# Patient Record
Sex: Female | Born: 1985 | Race: White | Hispanic: No | Marital: Married | Smoking: Former smoker
Health system: Southern US, Community
[De-identification: ages and names within clinical notes are randomized; demographics above are authoritative.]

## PROBLEM LIST (undated history)

## (undated) DIAGNOSIS — E349 Endocrine disorder, unspecified: Secondary | ICD-10-CM

## (undated) DIAGNOSIS — F419 Anxiety disorder, unspecified: Secondary | ICD-10-CM

## (undated) DIAGNOSIS — K589 Irritable bowel syndrome without diarrhea: Secondary | ICD-10-CM

## (undated) DIAGNOSIS — N939 Abnormal uterine and vaginal bleeding, unspecified: Secondary | ICD-10-CM

## (undated) DIAGNOSIS — D239 Other benign neoplasm of skin, unspecified: Secondary | ICD-10-CM

## (undated) DIAGNOSIS — F329 Major depressive disorder, single episode, unspecified: Secondary | ICD-10-CM

## (undated) DIAGNOSIS — F32A Depression, unspecified: Secondary | ICD-10-CM

## (undated) DIAGNOSIS — IMO0002 Reserved for concepts with insufficient information to code with codable children: Secondary | ICD-10-CM

## (undated) DIAGNOSIS — N946 Dysmenorrhea, unspecified: Secondary | ICD-10-CM

## (undated) DIAGNOSIS — D649 Anemia, unspecified: Secondary | ICD-10-CM

## (undated) DIAGNOSIS — Z8619 Personal history of other infectious and parasitic diseases: Secondary | ICD-10-CM

## (undated) DIAGNOSIS — E559 Vitamin D deficiency, unspecified: Secondary | ICD-10-CM

## (undated) HISTORY — DX: Major depressive disorder, single episode, unspecified: F32.9

## (undated) HISTORY — PX: MINOR HEMORRHOIDECTOMY: SHX6238

## (undated) HISTORY — PX: DILATION AND CURETTAGE OF UTERUS: SHX78

## (undated) HISTORY — DX: Other benign neoplasm of skin, unspecified: D23.9

## (undated) HISTORY — DX: Irritable bowel syndrome without diarrhea: K58.9

## (undated) HISTORY — PX: LEEP: SHX91

## (undated) HISTORY — DX: Personal history of other infectious and parasitic diseases: Z86.19

## (undated) HISTORY — DX: Dysmenorrhea, unspecified: N94.6

## (undated) HISTORY — DX: Abnormal uterine and vaginal bleeding, unspecified: N93.9

## (undated) HISTORY — DX: Anemia, unspecified: D64.9

## (undated) HISTORY — DX: Endocrine disorder, unspecified: E34.9

## (undated) HISTORY — DX: Depression, unspecified: F32.A

## (undated) HISTORY — DX: Anxiety disorder, unspecified: F41.9

## (undated) HISTORY — DX: Reserved for concepts with insufficient information to code with codable children: IMO0002

## (undated) HISTORY — DX: Vitamin D deficiency, unspecified: E55.9

## (undated) HISTORY — PX: COLPOSCOPY: SHX161

---

## 2012-01-08 LAB — OB RESULTS CONSOLE GC/CHLAMYDIA
Chlamydia: NEGATIVE
Gonorrhea: NEGATIVE

## 2012-01-08 LAB — OB RESULTS CONSOLE RUBELLA ANTIBODY, IGM: Rubella: IMMUNE

## 2012-01-08 LAB — OB RESULTS CONSOLE ANTIBODY SCREEN: Antibody Screen: NEGATIVE

## 2012-05-01 NOTE — L&D Delivery Note (Signed)
Patient was C/C/+2 and pushed for 10 minutes with epidural.   NSVD  female infant, Apgars 8,9, weight P.   The patient had no lacerations. Fundus was firm. EBL was expected. Placenta was delivered intact. Vagina was clear.  Baby was vigorous to bedside.  HORVATH,MICHELLE A

## 2012-06-05 LAB — OB RESULTS CONSOLE GBS: GBS: NEGATIVE

## 2012-06-29 ENCOUNTER — Inpatient Hospital Stay (HOSPITAL_COMMUNITY): Admission: AD | Admit: 2012-06-29 | Payer: Self-pay | Source: Ambulatory Visit | Admitting: Obstetrics and Gynecology

## 2012-07-03 ENCOUNTER — Telehealth (HOSPITAL_COMMUNITY): Payer: Self-pay | Admitting: *Deleted

## 2012-07-03 ENCOUNTER — Encounter (HOSPITAL_COMMUNITY): Payer: Self-pay | Admitting: *Deleted

## 2012-07-03 NOTE — Telephone Encounter (Signed)
Preadmission screen  

## 2012-07-04 ENCOUNTER — Inpatient Hospital Stay (HOSPITAL_COMMUNITY)
Admission: RE | Admit: 2012-07-04 | Discharge: 2012-07-06 | DRG: 775 | Disposition: A | Discharge status: Home / Self Care | Payer: 59 | Source: Ambulatory Visit | Attending: Obstetrics and Gynecology | Admitting: Obstetrics and Gynecology

## 2012-07-04 ENCOUNTER — Inpatient Hospital Stay (HOSPITAL_COMMUNITY): Payer: 59 | Admitting: Anesthesiology

## 2012-07-04 ENCOUNTER — Encounter (HOSPITAL_COMMUNITY): Payer: Self-pay

## 2012-07-04 ENCOUNTER — Encounter (HOSPITAL_COMMUNITY): Payer: Self-pay | Admitting: Anesthesiology

## 2012-07-04 DIAGNOSIS — O480 Post-term pregnancy: Principal | ICD-10-CM | POA: Diagnosis present

## 2012-07-04 LAB — CBC
Platelets: 228 10*3/uL (ref 150–400)
RBC: 4.19 MIL/uL (ref 3.87–5.11)
WBC: 12.3 10*3/uL — ABNORMAL HIGH (ref 4.0–10.5)

## 2012-07-04 MED ORDER — LACTATED RINGERS IV SOLN
500.0000 mL | Freq: Once | INTRAVENOUS | Status: AC
  Administered 2012-07-04: 500 mL via INTRAVENOUS

## 2012-07-04 MED ORDER — PHENYLEPHRINE 40 MCG/ML (10ML) SYRINGE FOR IV PUSH (FOR BLOOD PRESSURE SUPPORT)
80.0000 ug | PREFILLED_SYRINGE | INTRAVENOUS | Status: DC | PRN
  Filled 2012-07-04: qty 5

## 2012-07-04 MED ORDER — LACTATED RINGERS IV SOLN: 500.0000 mL | INTRAVENOUS | Status: DC | PRN

## 2012-07-04 MED ORDER — CITRIC ACID-SODIUM CITRATE 334-500 MG/5ML PO SOLN: 30.0000 mL | ORAL | Status: DC | PRN

## 2012-07-04 MED ORDER — OXYTOCIN 40 UNITS IN LACTATED RINGERS INFUSION - SIMPLE MED: 62.5000 mL/h | INTRAVENOUS | Status: DC

## 2012-07-04 MED ORDER — TERBUTALINE SULFATE 1 MG/ML IJ SOLN: 0.2500 mg | Freq: Once | INTRAMUSCULAR | Status: AC | PRN

## 2012-07-04 MED ORDER — EPHEDRINE 5 MG/ML INJ
10.0000 mg | INTRAVENOUS | Status: DC | PRN
  Filled 2012-07-04: qty 4

## 2012-07-04 MED ORDER — PHENYLEPHRINE 40 MCG/ML (10ML) SYRINGE FOR IV PUSH (FOR BLOOD PRESSURE SUPPORT): 80.0000 ug | PREFILLED_SYRINGE | INTRAVENOUS | Status: DC | PRN

## 2012-07-04 MED ORDER — DIPHENHYDRAMINE HCL 50 MG/ML IJ SOLN: 12.5000 mg | INTRAMUSCULAR | Status: DC | PRN

## 2012-07-04 MED ORDER — EPHEDRINE 5 MG/ML INJ: 10.0000 mg | INTRAVENOUS | Status: DC | PRN

## 2012-07-04 MED ORDER — OXYCODONE-ACETAMINOPHEN 5-325 MG PO TABS: 1.0000 | ORAL_TABLET | ORAL | Status: DC | PRN

## 2012-07-04 MED ORDER — OXYTOCIN BOLUS FROM INFUSION: 500.0000 mL | INTRAVENOUS | Status: DC

## 2012-07-04 MED ORDER — ONDANSETRON HCL 4 MG/2ML IJ SOLN: 4.0000 mg | Freq: Four times a day (QID) | INTRAMUSCULAR | Status: DC | PRN

## 2012-07-04 MED ORDER — LIDOCAINE HCL (PF) 1 % IJ SOLN: 30.0000 mL | INTRAMUSCULAR | Status: DC | PRN

## 2012-07-04 MED ORDER — BUTORPHANOL TARTRATE 1 MG/ML IJ SOLN: 1.0000 mg | INTRAMUSCULAR | Status: DC | PRN

## 2012-07-04 MED ORDER — IBUPROFEN 600 MG PO TABS
600.0000 mg | ORAL_TABLET | Freq: Four times a day (QID) | ORAL | Status: DC | PRN
  Administered 2012-07-05: 600 mg via ORAL
  Filled 2012-07-04: qty 1

## 2012-07-04 MED ORDER — ZOLPIDEM TARTRATE 5 MG PO TABS: 5.0000 mg | ORAL_TABLET | Freq: Every evening | ORAL | Status: DC | PRN

## 2012-07-04 MED ORDER — LACTATED RINGERS IV SOLN
INTRAVENOUS | Status: DC
  Administered 2012-07-04 (×2): via INTRAVENOUS

## 2012-07-04 MED ORDER — OXYTOCIN 40 UNITS IN LACTATED RINGERS INFUSION - SIMPLE MED
1.0000 m[IU]/min | INTRAVENOUS | Status: DC
  Administered 2012-07-04: 2 m[IU]/min via INTRAVENOUS
  Filled 2012-07-04: qty 1000

## 2012-07-04 MED ORDER — FENTANYL 2.5 MCG/ML BUPIVACAINE 1/10 % EPIDURAL INFUSION (WH - ANES)
14.0000 mL/h | INTRAMUSCULAR | Status: DC
  Administered 2012-07-04: 14 mL/h via EPIDURAL
  Filled 2012-07-04: qty 125

## 2012-07-04 MED ORDER — ACETAMINOPHEN 325 MG PO TABS: 650.0000 mg | ORAL_TABLET | ORAL | Status: DC | PRN

## 2012-07-04 NOTE — Anesthesia Preprocedure Evaluation (Signed)

## 2012-07-04 NOTE — Anesthesia Procedure Notes (Signed)
Epidural Patient location during procedure: OB Start time: 07/04/2012 11:45 PM  Staffing Performed by: anesthesiologist   Preanesthetic Checklist Completed: patient identified, site marked, surgical consent, pre-op evaluation, timeout performed, IV checked, risks and benefits discussed and monitors and equipment checked  Epidural Patient position: sitting Prep: site prepped and draped and DuraPrep Patient monitoring: continuous pulse ox and blood pressure Approach: midline Injection technique: LOR air  Needle:  Needle type: Tuohy  Needle gauge: 17 G Needle length: 9 cm and 9 Needle insertion depth: 4.5 cm Catheter type: closed end flexible Catheter size: 19 Gauge Catheter at skin depth: 9.5 cm Test dose: negative  Assessment Events: blood not aspirated, injection not painful, no injection resistance, negative IV test and no paresthesia  Additional Notes Discussed risk of headache, infection, bleeding, nerve injury and failed or incomplete block.  Patient voices understanding and wishes to proceed.  Epidural placed easily on first attempt.  No paresthesia.  Patient tolerated procedure well with no apparent complications.  Jasmine December, MD Reason for block:procedure for pain

## 2012-07-05 ENCOUNTER — Encounter (HOSPITAL_COMMUNITY): Payer: Self-pay

## 2012-07-05 LAB — RPR: RPR Ser Ql: NONREACTIVE

## 2012-07-05 MED ORDER — DIPHENHYDRAMINE HCL 25 MG PO CAPS: 25.0000 mg | ORAL_CAPSULE | Freq: Four times a day (QID) | ORAL | Status: DC | PRN

## 2012-07-05 MED ORDER — SIMETHICONE 80 MG PO CHEW: 80.0000 mg | CHEWABLE_TABLET | ORAL | Status: DC | PRN

## 2012-07-05 MED ORDER — ONDANSETRON HCL 4 MG PO TABS: 4.0000 mg | ORAL_TABLET | ORAL | Status: DC | PRN

## 2012-07-05 MED ORDER — TETANUS-DIPHTH-ACELL PERTUSSIS 5-2.5-18.5 LF-MCG/0.5 IM SUSP
0.5000 mL | Freq: Once | INTRAMUSCULAR | Status: AC
  Administered 2012-07-06: 0.5 mL via INTRAMUSCULAR

## 2012-07-05 MED ORDER — METHYLERGONOVINE MALEATE 0.2 MG/ML IJ SOLN: 0.2000 mg | INTRAMUSCULAR | Status: DC | PRN

## 2012-07-05 MED ORDER — WITCH HAZEL-GLYCERIN EX PADS: 1.0000 "application " | MEDICATED_PAD | CUTANEOUS | Status: DC | PRN

## 2012-07-05 MED ORDER — SENNOSIDES-DOCUSATE SODIUM 8.6-50 MG PO TABS
2.0000 | ORAL_TABLET | Freq: Every day | ORAL | Status: DC
  Administered 2012-07-05: 2 via ORAL

## 2012-07-05 MED ORDER — MAGNESIUM HYDROXIDE 400 MG/5ML PO SUSP: 30.0000 mL | ORAL | Status: DC | PRN

## 2012-07-05 MED ORDER — DIBUCAINE 1 % RE OINT: 1.0000 "application " | TOPICAL_OINTMENT | RECTAL | Status: DC | PRN

## 2012-07-05 MED ORDER — PRENATAL MULTIVITAMIN CH
1.0000 | ORAL_TABLET | Freq: Every day | ORAL | Status: DC
  Administered 2012-07-05 – 2012-07-06 (×2): 1 via ORAL
  Filled 2012-07-05 (×3): qty 1

## 2012-07-05 MED ORDER — IBUPROFEN 800 MG PO TABS
800.0000 mg | ORAL_TABLET | Freq: Three times a day (TID) | ORAL | Status: DC
  Administered 2012-07-05 – 2012-07-06 (×4): 800 mg via ORAL
  Filled 2012-07-05 (×4): qty 1

## 2012-07-05 MED ORDER — SODIUM CHLORIDE 0.9 % IJ SOLN: 3.0000 mL | INTRAMUSCULAR | Status: DC | PRN

## 2012-07-05 MED ORDER — ONDANSETRON HCL 4 MG/2ML IJ SOLN: 4.0000 mg | INTRAMUSCULAR | Status: DC | PRN

## 2012-07-05 MED ORDER — MEASLES, MUMPS & RUBELLA VAC ~~LOC~~ INJ
0.5000 mL | INJECTION | Freq: Once | SUBCUTANEOUS | Status: DC
  Filled 2012-07-05: qty 0.5

## 2012-07-05 MED ORDER — SODIUM CHLORIDE 0.9 % IV SOLN: 250.0000 mL | INTRAVENOUS | Status: DC | PRN

## 2012-07-05 MED ORDER — METHYLERGONOVINE MALEATE 0.2 MG PO TABS: 0.2000 mg | ORAL_TABLET | ORAL | Status: DC | PRN

## 2012-07-05 MED ORDER — LIDOCAINE HCL (PF) 1 % IJ SOLN
INTRAMUSCULAR | Status: DC | PRN
  Administered 2012-07-04 (×4): 4 mL

## 2012-07-05 MED ORDER — FERROUS SULFATE 325 (65 FE) MG PO TABS
325.0000 mg | ORAL_TABLET | Freq: Two times a day (BID) | ORAL | Status: DC
  Administered 2012-07-06: 325 mg via ORAL
  Filled 2012-07-05 (×3): qty 1

## 2012-07-05 MED ORDER — BENZOCAINE-MENTHOL 20-0.5 % EX AERO
1.0000 "application " | INHALATION_SPRAY | CUTANEOUS | Status: DC | PRN
  Filled 2012-07-05: qty 56

## 2012-07-05 MED ORDER — SODIUM CHLORIDE 0.9 % IJ SOLN: 3.0000 mL | Freq: Two times a day (BID) | INTRAMUSCULAR | Status: DC

## 2012-07-05 MED ORDER — ZOLPIDEM TARTRATE 5 MG PO TABS: 5.0000 mg | ORAL_TABLET | Freq: Every evening | ORAL | Status: DC | PRN

## 2012-07-05 MED ORDER — OXYCODONE-ACETAMINOPHEN 5-325 MG PO TABS
1.0000 | ORAL_TABLET | ORAL | Status: DC | PRN
  Administered 2012-07-05 – 2012-07-06 (×2): 1 via ORAL
  Filled 2012-07-05 (×2): qty 1

## 2012-07-05 MED ORDER — LANOLIN HYDROUS EX OINT: TOPICAL_OINTMENT | CUTANEOUS | Status: DC | PRN

## 2012-07-05 NOTE — Anesthesia Postprocedure Evaluation (Signed)
  Anesthesia Post-op Note  Patient: Victoria York  Procedure(s) Performed: * No procedures listed *  Patient Location: PACU and Mother/Baby  Anesthesia Type:Epidural  Level of Consciousness: awake, alert  and oriented  Airway and Oxygen Therapy: Patient Spontanous Breathing  Post-op Pain: none  Post-op Assessment: Post-op Vital signs reviewed, Patient's Cardiovascular Status Stable, No headache, No backache, No residual numbness and No residual motor weakness  Post-op Vital Signs: Reviewed and stable  Complications: No apparent anesthesia complications

## 2012-07-05 NOTE — H&P (Signed)
27 y.o. [redacted]w[redacted]d  [id number] comes in for post dates induction.  Otherwise has good fetal movement and no bleeding.  Past Medical History  Diagnosis Date  . Hx of varicella   . Abnormal Pap smear   . Anemia     previous pregnancy    Past Surgical History  Procedure Laterality Date  . Colposcopy    . Leep    . Minor hemorrhoidectomy    . Dilation and curettage of uterus      OB History   Grav Para Term Preterm Abortions TAB SAB Ect Mult Living   5 1 1  3 1 2   1      # Outc Date GA Lbr Len/2nd Wgt Sex Del Anes PTL Lv   1 TAB 2003           2 SAB 2007           3 [facility] 2008 [redacted]w[redacted]d 13:00 9.528UX(3KG4WN) F SVD EPI  Yes   4 SAB 2009           5 CUR               History   Social History  . Marital Status: Married    Spouse Name: N/A    Number of Children: N/A  . Years of Education: N/A   Occupational History  . Not on file.   Social History Main Topics  . Smoking status: Not on file  . Smokeless tobacco: Not on file  . Alcohol Use: No  . Drug Use: No  . Sexually Active: Yes   Other Topics Concern  . Not on file   Social History Narrative  . No narrative on file   Penicillins    Prenatal Transfer Tool  Maternal Diabetes: No Genetic Screening: Normal Maternal Ultrasounds/Referrals: Normal Fetal Ultrasounds or other Referrals:  None Maternal Substance Abuse:  No Significant Maternal Medications:  None Significant Maternal Lab Results: None  Other [id number]    Filed Vitals:   07/05/12 0431  BP: 107/70  Pulse: 79  Temp:   Resp: 18     Lungs/Cor:  NAD Abdomen:  soft, gravid Ex:  no cords, erythema SVE:  Now 8/C/-2 per nurse after pitocin FHTs:  130, good STV, NST R, now occasional mild variables Toco:  q2-3   A/P  Post term induction.  GBS neg.  Yan Okray A

## 2012-07-06 LAB — CBC
Hemoglobin: 10.5 g/dL — ABNORMAL LOW (ref 12.0–15.0)
MCH: 27.7 pg (ref 26.0–34.0)
MCHC: 33.2 g/dL (ref 30.0–36.0)
Platelets: 146 10*3/uL — ABNORMAL LOW (ref 150–400)
RBC: 3.79 MIL/uL — ABNORMAL LOW (ref 3.87–5.11)

## 2012-07-06 MED ORDER — OXYCODONE-ACETAMINOPHEN 5-325 MG PO TABS: 1.0000 | ORAL_TABLET | ORAL | Status: DC | PRN

## 2012-07-06 NOTE — Discharge Summary (Signed)
Obstetric Discharge Summary Reason for Admission: induction of labor Prenatal Procedures: none Intrapartum Procedures: spontaneous vaginal delivery Postpartum Procedures: none Complications-Operative and Postpartum: none Hemoglobin  Date Value Range Status  07/06/2012 10.5* 12.0 - 15.0 g/dL Final     HCT  Date Value Range Status  07/06/2012 31.6* 36.0 - 46.0 % Final    Physical Exam:  General: alert and cooperative Lochia: appropriate Uterine Fundus: firm DVT Evaluation: No evidence of DVT seen on physical exam.  Discharge Diagnoses: Term Pregnancy-delivered  Discharge Information: Date: 07/06/2012 Activity: pelvic rest Diet: routine Medications: PNV, Ibuprofen and Percocet Condition: stable Instructions: refer to practice specific booklet Discharge to: home Follow-up Information   Follow up with HORVATH,MICHELLE A, MD In 4 weeks.   Contact information:   [address] [place] [address] [phone]       Newborn Data: Live born female  Birth Weight: 7 lb 13.2 oz (3549 g) APGAR: 8, 9  Home with mother.  Trask Vosler Aspen 07/06/2012, 10:10 AM

## 2012-07-10 ENCOUNTER — Ambulatory Visit (HOSPITAL_COMMUNITY)
Admission: RE | Admit: 2012-07-10 | Discharge: 2012-07-10 | Disposition: A | Discharge status: Home / Self Care | Payer: 59 | Source: Ambulatory Visit | Attending: Obstetrics and Gynecology | Admitting: Obstetrics and Gynecology

## 2012-07-17 ENCOUNTER — Telehealth (HOSPITAL_COMMUNITY): Payer: Self-pay | Admitting: *Deleted

## 2012-07-17 NOTE — Lactation Note (Signed)
Infant Lactation Consultation Outpatient Visit Note  Patient Name: Victoria York      [name] Date of Birth: May 07, 1985               DOB [birth year] Birth Weight:                                     7 lbs 13 ounces        Hospital discharge wt 7 lbs 5.3 ounces  ( 03/09 )          Today's wt on 03/12   is 7 lbs 8 ounces Gestational Age at Delivery: Gestational Age: <None>         38 6/7   Weeks    Now DOL 5 41 4/7 weeks corrected gestation Type of Delivery: vaginal  Breastfeeding History Frequency of Breastfeeding:  Length of Feeding:  Voids:  Stools:   Supplementing / Method: Pumping:  Type of Pump:   DEP   Frequency:2-3 times a day  Volume:    Comments:    Consultation Evaluation:    Mom and baby 5 days post partum. The baby is post term, was at 6.3% wt loss at discharge, and at 39 days old, is now at 4 % wt loss since birth. The baby is gaining, but eating every 1-2 hours, and fussy. He transferred only 26 mls of breast milk at the breast  in a 45 minute stretch, with 4 separate latches. His suck was somewhat disorganized, Mom reports very sore nipples, and despite what appeared to be a good latch, continued with pain throughout the feeding. With having Zoey suck on  a gloved finger, it was felt that she was not always keeping her tongue over her gums, and this was probably causing mom's pain . Mom was taught how to do suck training. Since Zoey was not transferring well at the breast, and mom was exhausted, it was suggested that mom pump with DEP every 3 hours, and bottle feed what she expresses. If she wants to add in breast feeding,to limit her time at the breast to 15-30 minutes, and then after  Offer Zoey  the bottle of EBM. Mom is to follow up at her pediatricians, and will come back for O/P lactation as needed.   Initial Feeding Assessment: Pre-feed ZOXWRU:0454 Post-feed UJWJXB:1478 Amount Transferred:8 Comments:  Additional Feeding Assessment: Pre-feed  Weight:3392 Post-feed GNFAOZ:3086 Amount Transferred:12 Comments:  Additional Feeding Assessment: Pre-feed VHQION:6295 Post-feed Weight:3410 Amount Transferred:6 Comments:  Total Breast milk Transferred this Visit: 26 Total Supplement Given:   Additional Interventions:     Mom to get All Purpose nipple cream, and watch for signs of mastitis, which was not thought mom has at this time. Her nipples have bilateral stripes, with some bleeding.    Follow-Up  O/p lactation prn      Alfred Levins 07/17/2012, 5:01 PM

## 2012-07-17 NOTE — Telephone Encounter (Signed)
Resolve episode 

## 2014-03-02 ENCOUNTER — Encounter (HOSPITAL_COMMUNITY): Payer: Self-pay

## 2016-06-07 DIAGNOSIS — K589 Irritable bowel syndrome without diarrhea: Secondary | ICD-10-CM | POA: Insufficient documentation

## 2016-06-07 DIAGNOSIS — J3489 Other specified disorders of nose and nasal sinuses: Secondary | ICD-10-CM | POA: Insufficient documentation

## 2016-06-07 HISTORY — DX: Irritable bowel syndrome without diarrhea: K58.9

## 2016-08-09 DIAGNOSIS — K30 Functional dyspepsia: Secondary | ICD-10-CM | POA: Insufficient documentation

## 2016-12-28 ENCOUNTER — Ambulatory Visit: Payer: Self-pay | Admitting: Family Medicine

## 2017-08-01 DIAGNOSIS — R05 Cough: Secondary | ICD-10-CM | POA: Diagnosis not present

## 2017-08-01 DIAGNOSIS — J018 Other acute sinusitis: Secondary | ICD-10-CM | POA: Diagnosis not present

## 2017-08-01 DIAGNOSIS — M791 Myalgia, unspecified site: Secondary | ICD-10-CM | POA: Diagnosis not present

## 2017-12-11 ENCOUNTER — Encounter

## 2017-12-11 ENCOUNTER — Encounter: Payer: Self-pay | Admitting: Family Medicine

## 2017-12-11 ENCOUNTER — Ambulatory Visit: Payer: 59 | Admitting: Family Medicine

## 2017-12-11 VITALS — BP 110/60 | HR 82 | Temp 98.7°F | Ht 65.5 in | Wt 126.8 lb

## 2017-12-11 DIAGNOSIS — K219 Gastro-esophageal reflux disease without esophagitis: Secondary | ICD-10-CM | POA: Diagnosis not present

## 2017-12-11 DIAGNOSIS — Z13220 Encounter for screening for lipoid disorders: Secondary | ICD-10-CM | POA: Diagnosis not present

## 2017-12-11 DIAGNOSIS — E559 Vitamin D deficiency, unspecified: Secondary | ICD-10-CM | POA: Diagnosis not present

## 2017-12-11 DIAGNOSIS — F321 Major depressive disorder, single episode, moderate: Secondary | ICD-10-CM | POA: Diagnosis not present

## 2017-12-11 DIAGNOSIS — Z87898 Personal history of other specified conditions: Secondary | ICD-10-CM

## 2017-12-11 DIAGNOSIS — R42 Dizziness and giddiness: Secondary | ICD-10-CM

## 2017-12-11 DIAGNOSIS — D239 Other benign neoplasm of skin, unspecified: Secondary | ICD-10-CM | POA: Insufficient documentation

## 2017-12-11 DIAGNOSIS — F419 Anxiety disorder, unspecified: Secondary | ICD-10-CM | POA: Insufficient documentation

## 2017-12-11 DIAGNOSIS — R5381 Other malaise: Secondary | ICD-10-CM | POA: Diagnosis not present

## 2017-12-11 DIAGNOSIS — R5383 Other fatigue: Secondary | ICD-10-CM | POA: Diagnosis not present

## 2017-12-11 HISTORY — DX: Anxiety disorder, unspecified: F41.9

## 2017-12-11 HISTORY — DX: Vitamin D deficiency, unspecified: E55.9

## 2017-12-11 HISTORY — DX: Other benign neoplasm of skin, unspecified: D23.9

## 2017-12-11 LAB — COMPREHENSIVE METABOLIC PANEL
ALT: 10 U/L (ref 0–35)
AST: 12 U/L (ref 0–37)
Albumin: 5 g/dL (ref 3.5–5.2)
Alkaline Phosphatase: 37 U/L — ABNORMAL LOW (ref 39–117)
BUN: 12 mg/dL (ref 6–23)
CO2: 28 mEq/L (ref 19–32)
Calcium: 9.7 mg/dL (ref 8.4–10.5)
Chloride: 102 mEq/L (ref 96–112)
Creatinine, Ser: 0.78 mg/dL (ref 0.40–1.20)
GFR: 89.97 mL/min (ref >60.00)
Glucose, Bld: 76 mg/dL (ref 70–99)
Potassium: 4 mEq/L (ref 3.5–5.1)
Sodium: 137 mEq/L (ref 135–145)
Total Bilirubin: 0.8 mg/dL (ref 0.2–1.2)
Total Protein: 7.8 g/dL (ref 6.0–8.3)

## 2017-12-11 LAB — LIPID PANEL
Cholesterol: 178 mg/dL (ref 0–200)
HDL: 60 mg/dL (ref >39.00)
LDL Cholesterol: 103 mg/dL — ABNORMAL HIGH (ref 0–99)
NonHDL: 118.01
Total CHOL/HDL Ratio: 3
Triglycerides: 75 mg/dL (ref 0.0–149.0)
VLDL: 15 mg/dL (ref 0.0–40.0)

## 2017-12-11 LAB — CBC WITH DIFFERENTIAL/PLATELET
Basophils Absolute: 0.1 10*3/uL (ref 0.0–0.1)
Basophils Relative: 0.9 % (ref 0.0–3.0)
Eosinophils Absolute: 0.1 10*3/uL (ref 0.0–0.7)
Eosinophils Relative: 0.8 % (ref 0.0–5.0)
HCT: 40.3 % (ref 36.0–46.0)
Hemoglobin: 13.9 g/dL (ref 12.0–15.0)
Lymphocytes Relative: 24.5 % (ref 12.0–46.0)
Lymphs Abs: 1.6 10*3/uL (ref 0.7–4.0)
MCHC: 34.4 g/dL (ref 30.0–36.0)
MCV: 84.1 fl (ref 78.0–100.0)
Monocytes Absolute: 0.3 10*3/uL (ref 0.1–1.0)
Monocytes Relative: 4.8 % (ref 3.0–12.0)
Neutro Abs: 4.4 10*3/uL (ref 1.4–7.7)
Neutrophils Relative %: 69 % (ref 43.0–77.0)
Platelets: 273 10*3/uL (ref 150.0–400.0)
RBC: 4.79 Mil/uL (ref 3.87–5.11)
RDW: 12.4 % (ref 11.5–15.5)
WBC: 6.3 10*3/uL (ref 4.0–10.5)

## 2017-12-11 LAB — VITAMIN B12: Vitamin B-12: 377 pg/mL (ref 211–911)

## 2017-12-11 LAB — H. PYLORI ANTIBODY, IGG: H Pylori IgG: NEGATIVE

## 2017-12-11 LAB — TSH: TSH: 1.3 u[IU]/mL (ref 0.35–4.50)

## 2017-12-11 LAB — HCG, QUANTITATIVE, PREGNANCY: Quantitative HCG: <0.6 m[IU]/mL

## 2017-12-11 LAB — VITAMIN D 25 HYDROXY (VIT D DEFICIENCY, FRACTURES): VITD: 59.05 ng/mL (ref 30.00–100.00)

## 2017-12-11 MED ORDER — ESOMEPRAZOLE MAGNESIUM 20 MG PO CPDR: 20.0000 mg | DELAYED_RELEASE_CAPSULE | Freq: Every day | ORAL | 3 refills | Status: DC

## 2017-12-11 NOTE — Progress Notes (Signed)
Victoria York is a 32 y.o. female is here to Victoria York.   Patient Care Team: Briscoe Deutscher, DO as PCP - General (Family Medicine)   History of Present Illness:   Victoria York, CMA acting as scribe for Dr. Briscoe Deutscher.   HPI: Patient in office to establish care. She has several problems that she would like to talk about today.  Dizziness: She states that she has had issue with this her whole life. In the last 3 years it has gotten worse. She states that she feels like her "equilibrium" is off and things are going side to side. She does have nausea but no vomiting. She has fainted a few times the most recent being about three months ago when she was getting up from sitting position after feeling dizzy. She does have history of ringing in ears but this has never been evaluated. She is concerned that it may be vertigo. She would like to have ears evaluated today. As far as fainting spells she feels that it may be low blood pressure. The last episode was after a hot bath then getting up from using the rest room.   She has been treated for with lexapro in the past but stopped it about a year ago due to increased fatigue. She has been having increased irritability and anxiety and would like to talk about medication options. She also mentions that she has had increased memory issues.She feels like she can not remember words.  She denies any headaches. She does have some vision changes when she has the fainting spells.    Health Maintenance Due  Topic Date Due  . PAP SMEAR  02/23/2005  . INFLUENZA VACCINE  11/29/2017   Depression screen PHQ 2/9 12/11/2017  Decreased Interest 2  Down, Depressed, Hopeless 0  PHQ - 2 Score 2  Altered sleeping 1  Tired, decreased energy 1  Change in appetite 3  Feeling bad or failure about yourself  1  Trouble concentrating 3  Moving slowly or fidgety/restless 0  Suicidal thoughts 0  PHQ-9 Score 11  Difficult doing work/chores Somewhat difficult     PMHx, SurgHx, SocialHx, Medications, and Allergies were reviewed in the Visit Navigator and updated as appropriate.   Past Medical History:  Diagnosis Date  . Abnormal Pap smear   . Anemia with pregnancies   . Anxiety 12/11/2017  . Dysplastic nevus of skin 12/11/2017  . Hx of varicella   . Irritable bowel syndrome 06/07/2016  . Vitamin D deficiency 12/11/2017     Past Surgical History:  Procedure Laterality Date  . COLPOSCOPY    . DILATION AND CURETTAGE OF UTERUS    . LEEP    . MINOR HEMORRHOIDECTOMY       Family History  Problem Relation Age of Onset  . Alcohol abuse Mother   . Arthritis Mother   . Depression Mother   . Drug abuse Mother   . Miscarriages / [place] Mother   . Bipolar disorder Mother   . Alcohol abuse Father   . COPD Father   . Depression Sister   . Colon cancer Maternal Aunt   . Ovarian cancer Maternal Aunt   . Hypertension Maternal Grandmother   . Alcohol abuse Sister   . Alcohol abuse Sister     Social History   Tobacco Use  . Smoking status: Former Smoker    Last attempt to quit: 12/12/2007    Years since quitting: 10.0  . Smokeless tobacco: Never Used  Substance Use Topics  . Alcohol use: No  . Drug use: No    Current Medications and Allergies:   .  esomeprazole (NEXIUM) 20 MG capsule, Take 1 capsule (20 mg total) by mouth daily at 12 noon., Disp: 30 capsule, Rfl: 3   Allergies  Allergen Reactions  . Penicillins Hives   Review of Systems:   Pertinent items are noted in the HPI. Otherwise, ROS is negative.  Vitals:   Vitals:   12/11/17 1039  BP: 110/60  Pulse: 82  Temp: 98.7 F (37.1 C)  TempSrc: Oral  SpO2: 97%  Weight: 126 lb 12.8 oz (57.5 kg)  Height: 5' 5.5" (1.664 m)     Body mass index is 20.78 kg/m.  Physical Exam:   Physical Exam  Constitutional: She appears well-nourished.  HENT:  Head: Normocephalic and atraumatic.  Eyes: Pupils are equal, round, and reactive to light. EOM are normal.  York:  Normal range of motion. York supple.  Cardiovascular: Normal rate, regular rhythm, normal heart sounds and intact distal pulses.  Pulmonary/Chest: Effort normal.  Abdominal: Soft.  Skin: Skin is warm.  Psychiatric: She has a normal mood and affect. Her behavior is normal.  Nursing note and vitals reviewed.  Assessment and Plan:   Manha was seen today for establish care.  Diagnoses and all orders for this visit:  Malaise and fatigue -     CBC with Differential/Platelet -     Comprehensive metabolic panel -     TSH -     Vitamin B12 -     Ambulatory referral to Sleep Studies -     Thyroid peroxidase antibody -     B-HCG Quant  Vitamin D deficiency -     VITAMIN D 25 Hydroxy (Vit-D Deficiency, Fractures)  Chronic GERD -     esomeprazole (NEXIUM) 20 MG capsule; Take 1 capsule (20 mg total) by mouth daily at 12 noon. -     H. pylori antibody, IgG -     Celiac Pnl 2 rflx Endomysial Ab Ttr  Depression, major, single episode, moderate (HCC) Comments: The pateint will present next week for a CPA with PAP. Her daughters are with her today, so we will discuss this in more depth at the upcoming visit.  Vertigo Comments: Discussed vertigo and treatment of allergies as well as Epley Manuever.   History of syncope Comments: Patient given glucometer to check BG when she feels presyncopal. She will obtain a blood pressure cuff to check BP as well.  Screening for lipid disorders -     Lipid panel    . Reviewed expectations re: course of current medical issues. . Discussed self-management of symptoms. . Outlined signs and symptoms indicating need for more acute intervention. . Patient verbalized understanding and all questions were answered. Marland Kitchen Health Maintenance issues including appropriate healthy diet, exercise, and smoking avoidance were discussed with patient. . See orders for this visit as documented in the electronic medical record. . Patient received an After Visit  Summary.  CMA served as Education administrator during this visit. History, Physical, and Plan performed by medical provider. The above documentation has been reviewed and is accurate and complete. Briscoe Deutscher, D.O.  Briscoe Deutscher, DO [place] [place] [facility] 12/13/2017

## 2017-12-13 ENCOUNTER — Encounter: Payer: Self-pay | Admitting: Family Medicine

## 2017-12-16 NOTE — Progress Notes (Signed)
Subjective:    AJEENAH HEINY is a 32 y.o. female and is here for a comprehensive physical exam.  Current Outpatient Medications:  .  None  Health Maintenance Due  Topic Date Due  . INFLUENZA VACCINE  11/29/2017    PMHx, SurgHx, SocialHx, Medications, and Allergies were reviewed in the Visit Navigator and updated as appropriate.   Past Medical History:  Diagnosis Date  . Abnormal Pap smear   . Anemia with pregnancies   . Anxiety 12/11/2017  . Dysplastic nevus of skin 12/11/2017  . Hx of varicella   . Irritable bowel syndrome 06/07/2016  . Vitamin D deficiency 12/11/2017     Past Surgical History:  Procedure Laterality Date  . COLPOSCOPY    . DILATION AND CURETTAGE OF UTERUS    . LEEP    . MINOR HEMORRHOIDECTOMY       Family History  Problem Relation Age of Onset  . Alcohol abuse Mother   . Arthritis Mother   . Depression Mother   . Drug abuse Mother   . Miscarriages / [place] Mother   . Bipolar disorder Mother   . Alcohol abuse Father   . COPD Father   . Depression Sister   . Colon cancer Maternal Aunt   . Ovarian cancer Maternal Aunt   . Hypertension Maternal Grandmother   . Alcohol abuse Sister   . Alcohol abuse Sister     Social History   Tobacco Use  . Smoking status: Former Smoker    Last attempt to quit: 12/12/2007    Years since quitting: 10.0  . Smokeless tobacco: Never Used  Substance Use Topics  . Alcohol use: No  . Drug use: No    Review of Systems:   Pertinent items are noted in the HPI. Otherwise, ROS is negative.  Objective:   BP 110/60   Pulse 84   Temp 98.6 F (37 C) (Oral)   Ht 5' 5.5" (1.664 m)   Wt 127 lb 6.4 oz (57.8 kg)   LMP 11/16/2017   SpO2 98%   BMI 20.88 kg/m    General appearance: alert, cooperative and appears stated age. Head: normocephalic, without obvious abnormality, atraumatic. Neck: no adenopathy, supple, symmetrical, trachea midline; thyroid not enlarged, symmetric, no  tenderness/mass/nodules. Lungs: clear to auscultation bilaterally. Heart: regular rate and rhythm Abdomen: soft, non-tender; no masses,  no organomegaly. Extremities: extremities normal, atraumatic, no cyanosis or edema. Skin: skin color, texture, turgor normal, no rashes or lesions. Lymph: cervical, supraclavicular, and axillary nodes normal; no abnormal inguinal nodes palpated. Neurologic: grossly normal.  Pelvic:  External genitalia: no lesions. Urethra: normal appearing urethra with no masses, tenderness or lesions. Bartholins and Skenes: normal. Vagina: normal appearing vagina with normal color and discharge, no lesions. Cervix: normal appearance.Pap and high risk HPV testing done: Yes.   Bimanual Exam:   Uterus: uterus is normal size, shape, consistency and nontender. Adnexa: normal adnexa in size, nontender and no masses.  EKG: normal EKG, normal sinus rhythm.   Assessment/Plan:   Myracle was seen today for annual exam.  Diagnoses and all orders for this visit:  Routine physical examination  History of syncope -     EKG 12-Lead  Screening for cervical cancer -     Cytology - PAP  Single skin nodule Comments: Punch biopsy Indication: suspicious lesion Location: right thigh Size: 3 mm Verbal informed consent obtained.  Pt aware of risks not limited to but including infection, bleeding, damage to near by organs. Prep: etoh/betadine  Anesthesia: 1%lidocaine with epi, good effect Punch made with 3 mm punch Minimal oozing Tolerated well Routine postprocedure instructions d/w pt- keep area clean and bandaged, follow up if concerns/spreading erythema/pain.  Orders: -     Dermatology pathology  Anxiety -     propranolol (INDERAL) 20 MG tablet; Take 1 tablet (20 mg total) by mouth 3 (three) times daily.   Patient Counseling:   [x]     Nutrition: Stressed importance of moderation in sodium/caffeine intake, saturated fat and cholesterol, caloric balance, sufficient intake  of fresh fruits, vegetables, fiber, calcium, iron, and 1 mg of folate supplement per day (for females capable of pregnancy).   [x]      Stressed the importance of regular exercise.    [x]     Substance Abuse: Discussed cessation/primary prevention of tobacco, alcohol, or other drug use; driving or other dangerous activities under the influence; availability of treatment for abuse.    [x]      Injury prevention: Discussed safety belts, safety helmets, smoke detector, smoking near bedding or upholstery.    [x]      Sexuality: Discussed sexually transmitted diseases, partner selection, use of condoms, avoidance of unintended pregnancy  and contraceptive alternatives.    [x]     Dental health: Discussed importance of regular tooth brushing, flossing, and dental visits.   [x]      Health maintenance and immunizations reviewed. Please refer to Health maintenance section.   Briscoe Deutscher, DO [place]

## 2017-12-17 ENCOUNTER — Ambulatory Visit (INDEPENDENT_AMBULATORY_CARE_PROVIDER_SITE_OTHER): Payer: 59 | Admitting: Family Medicine

## 2017-12-17 ENCOUNTER — Encounter: Payer: Self-pay | Admitting: Family Medicine

## 2017-12-17 ENCOUNTER — Other Ambulatory Visit (HOSPITAL_COMMUNITY)
Admission: RE | Admit: 2017-12-17 | Discharge: 2017-12-17 | Disposition: A | Discharge status: Home / Self Care | Payer: 59 | Source: Ambulatory Visit | Attending: Family Medicine | Admitting: Family Medicine

## 2017-12-17 VITALS — BP 110/60 | HR 84 | Temp 98.6°F | Ht 65.5 in | Wt 127.4 lb

## 2017-12-17 DIAGNOSIS — Z124 Encounter for screening for malignant neoplasm of cervix: Secondary | ICD-10-CM | POA: Insufficient documentation

## 2017-12-17 DIAGNOSIS — Z0000 Encounter for general adult medical examination without abnormal findings: Secondary | ICD-10-CM | POA: Diagnosis not present

## 2017-12-17 DIAGNOSIS — Z87898 Personal history of other specified conditions: Secondary | ICD-10-CM

## 2017-12-17 DIAGNOSIS — D2371 Other benign neoplasm of skin of right lower limb, including hip: Secondary | ICD-10-CM | POA: Diagnosis not present

## 2017-12-17 DIAGNOSIS — F419 Anxiety disorder, unspecified: Secondary | ICD-10-CM

## 2017-12-17 DIAGNOSIS — R229 Localized swelling, mass and lump, unspecified: Secondary | ICD-10-CM

## 2017-12-17 MED ORDER — PROPRANOLOL HCL 20 MG PO TABS: 20.0000 mg | ORAL_TABLET | Freq: Three times a day (TID) | ORAL | 2 refills | Status: DC

## 2017-12-17 NOTE — Patient Instructions (Signed)

## 2017-12-20 LAB — CELIAC PNL 2 RFLX ENDOMYSIAL AB TTR
(tTG) Ab, IgA: <1 U/mL
(tTG) Ab, IgG: 2 U/mL
Endomysial Ab IgA: NEGATIVE
Gliadin IgA: 5 U (ref <20)
Gliadin IgG: 4 U (ref <20)
Immunoglobulin A: 139 mg/dL (ref 47–310)

## 2017-12-20 LAB — CYTOLOGY - PAP
Diagnosis: NEGATIVE
HPV: NOT DETECTED

## 2017-12-20 LAB — THYROID PEROXIDASE ANTIBODY: Thyroperoxidase Ab SerPl-aCnc: 1 IU/mL (ref <9)

## 2017-12-24 ENCOUNTER — Other Ambulatory Visit: Payer: Self-pay

## 2017-12-24 ENCOUNTER — Telehealth: Payer: Self-pay | Admitting: Family Medicine

## 2017-12-24 MED ORDER — BUSPIRONE HCL 7.5 MG PO TABS: 15.0000 mg | ORAL_TABLET | Freq: Two times a day (BID) | ORAL | 2 refills | Status: AC

## 2017-12-24 MED ORDER — BUSPIRONE HCL 7.5 MG PO TABS: 15.0000 mg | ORAL_TABLET | Freq: Two times a day (BID) | ORAL | 2 refills | Status: DC

## 2017-12-24 NOTE — Telephone Encounter (Signed)
Copied from [place] [id number]. Topic: Quick Communication - See Telephone Encounter >> Dec 24, 2017 10:15 AM Blase Mess A wrote: CRM for notification. See Telephone encounter for: 12/24/17. Patient has a question about propranolol (INDERAL) 20 MG tablet [[id number]]  She would not disclose on the telephone. Please advise. Call back number [phone] (M)

## 2017-12-24 NOTE — Telephone Encounter (Signed)
Buspar sent in. Take 7.5 mg twice daily x 1-2 weeks, then increase to 15 mg twice daily. Will need new Rx if tolerates and helps.

## 2017-12-24 NOTE — Telephone Encounter (Signed)
See note

## 2017-12-24 NOTE — Telephone Encounter (Signed)
Called patient let her know instructions. She will start and call back if any questions. I have called script into preferred pharmacy and took [facility] out of chart.

## 2017-12-24 NOTE — Telephone Encounter (Signed)
Called patient states that she does not feel that the propanolol is helping much. She started after her visit last week. She is starting a new job next week and would like to have something she can take everyday. She states that you talked tho her about other options at visit and she would like to try one for them. She states that lexaro is not a good option for her due to side effects and fatigue she has had on it in the past. Patient informed that you are out of the office but she would like to send message and see if we can get response from you .  She does have follow up scheduled with you already.

## 2018-01-04 ENCOUNTER — Ambulatory Visit: Payer: 59 | Admitting: Family Medicine

## 2018-01-04 ENCOUNTER — Encounter: Payer: Self-pay | Admitting: Family Medicine

## 2018-01-04 VITALS — BP 124/70 | HR 94 | Temp 98.9°F | Ht 65.5 in | Wt 128.4 lb

## 2018-01-04 DIAGNOSIS — J020 Streptococcal pharyngitis: Secondary | ICD-10-CM | POA: Diagnosis not present

## 2018-01-04 DIAGNOSIS — J029 Acute pharyngitis, unspecified: Secondary | ICD-10-CM | POA: Diagnosis not present

## 2018-01-04 DIAGNOSIS — F419 Anxiety disorder, unspecified: Secondary | ICD-10-CM

## 2018-01-04 LAB — POCT RAPID STREP A (OFFICE): RAPID STREP A SCREEN: NEGATIVE

## 2018-01-04 MED ORDER — AZITHROMYCIN 250 MG PO TABS: ORAL_TABLET | ORAL | 0 refills | Status: DC

## 2018-01-04 NOTE — Progress Notes (Signed)
PCP: Briscoe Deutscher, DO  Subjective:  Victoria York is a 32 y.o. year old very pleasant female patient who presents with  symptoms including sore throat, fatigue, subjective elevated temperature.   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 Woke up and felt bad sore throat and swollen lymph nodes. Subjective fever last night- no measured fever. Denies chills.  Started with a lot of sinus drainage this afternoon and feels nauseous Sinus infection earlier this year that turned into pneumonia. No sinus pressure.  Minimal cough.  Penicillin allergic.   ROS-denies chest pain, SOB, NVD, tooth pain  Pertinent Past Medical History-  Patient Active Problem List   Diagnosis Date Noted  . Dysplastic nevus of skin 12/11/2017  . Anxiety 12/11/2017  . Vitamin D deficiency 12/11/2017  . 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 Irritable bowel syndrome 06/07/2016  . Posterior rhinorrhea 06/07/2016    Medications- reviewed  Current Outpatient Medications  Medication Sig Dispense Refill  . propranolol (INDERAL) 20 MG tablet Take 1 tablet (20 mg total) by mouth 3 (three) times daily. 90 tablet 2  . busPIRone (BUSPAR) 7.5 MG tablet Take 2 tablets (15 mg total) by mouth 2 (two) times daily. (Patient not taking: Reported on 01/04/2018) 60 tablet 2   No current facility-administered medications for this visit.     Objective: BP 124/70 (BP Location: Left Arm, Patient Position: Sitting, Cuff Size: Normal)   Pulse 94   Temp 98.9 F (37.2 C) (Oral)   Ht 5' 5.5" (1.664 m)   Wt 128 lb 6.4 oz (58.2 kg)   SpO2 96%   BMI 21.04 kg/m  Gen: NAD, appears fatigued HEENT: Turbinates normal, TM normal, pharynx markedly erythematous with purulent tonsilar exudate. 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 Rapid strep negative but very high pretest probability so will go ahead and treat Antibiotic: azithromycin for 5 days Symptomatic treatment with: salt water gargles, can schedule tylenol 650 mg every 6 hours or can use motrin instead, sore throat lozenges  Advised contagious until at least 24 hours fever free and that treatment usually effective within 24-48 hours  Finally, we reviewed reasons to return to care including if symptoms worsen or persist or new concerns arise-particularly worsening pain or fever curve.   Anxiety Patient anxious about potential fatigue on buspirone. I advised her to trial perhaps 7.5 mg once daily or BID when at home on weekend- I will forward to PCP just as an FYI. If she tolerates well then can use more regularly in week. I told her in my experience generally not very sedating but she was very fatigued on lexapro 5mg  in past so she wants to be cautious   Meds ordered this encounter  Medications  .  azithromycin (ZITHROMAX) 250 MG tablet    Sig: Take 2 tabs on day 1, then 1 tab daily until finished    Dispense:  6 tablet    Refill:  0   Garret Reddish, MD

## 2018-01-04 NOTE — Assessment & Plan Note (Signed)
Patient anxious about potential fatigue on buspirone. I advised her to trial perhaps 7.5 mg once daily or BID when at home on weekend- I will forward to PCP just as an FYI. If she tolerates well then can use more regularly in week. I told her in my experience generally not very sedating but she was very fatigued on lexapro 5mg  in past so she wants to be cautious

## 2018-01-04 NOTE — Patient Instructions (Signed)
Streptococcal pharyngitis History and exam today are suggestive of bacterial infection due to group A streptococcus (tender enlarged lymph nodes, purulence on tonsils, subjective fever).  Antibiotic: azithromycin for 5 days Symptomatic treatment with: salt water gargles, can schedule tylenol 650 mg every 6 hours or can use motrin instead, sore throat lozenges  Advised contagious until at least 24 hours fever free and that treatment usually effective within 24-48 hours  Finally, we reviewed reasons to return to care including if symptoms worsen or persist or new concerns arise-particularly worsening pain or fever curve.   Meds ordered this encounter  Medications  . azithromycin (ZITHROMAX) 250 MG tablet    Sig: Take 2 tabs on day 1, then 1 tab daily until finished    Dispense:  6 tablet    Refill:  0

## 2018-01-08 ENCOUNTER — Encounter: Payer: Self-pay | Admitting: Neurology

## 2018-01-08 ENCOUNTER — Ambulatory Visit: Payer: 59 | Admitting: Neurology

## 2018-01-08 VITALS — BP 112/63 | HR 76 | Ht 65.0 in | Wt 128.0 lb

## 2018-01-08 DIAGNOSIS — G4710 Hypersomnia, unspecified: Secondary | ICD-10-CM

## 2018-01-08 DIAGNOSIS — R6889 Other general symptoms and signs: Secondary | ICD-10-CM

## 2018-01-08 DIAGNOSIS — R400 Somnolence: Secondary | ICD-10-CM | POA: Diagnosis not present

## 2018-01-08 DIAGNOSIS — G478 Other sleep disorders: Secondary | ICD-10-CM

## 2018-01-08 NOTE — Patient Instructions (Signed)
I believe you may have an underlying sleepiness condition. This means, that you have a sleep disorder that manifests with excessive sleep and excessive sleepiness during the day.  We will do sleep testing at this point. I would like for you to come in for a sleep study overnight and next day nap study for 5 scheduled 20 min nap opportunities, every 2 hours. We will remind you to stay awake in between naps.   As explained, you will have to be off of any additional caffeine or stimulant or antidepressant medication or anxiety medication in preparation for the sleep studies.

## 2018-01-08 NOTE — Progress Notes (Signed)
Subjective:    Patient ID: Victoria York is a 32 y.o. female.  HPI     Star Age, MD, PhD [facility] [address] [address] [place], [place] [address]  Dear Dr. Juleen China,   I saw your patient, Victoria York, upon your kind request, in my neurologic clinic today for initial consultation of her sleep disorder, in particular daytime somnolence. The patient is accompanied by her young 2 daughters today. As you know, [name] is a 32 year old right-handed woman with an underlying medical history of vitamin D deficiency, reflux disease, depression, history of vertigo, allergies, history of syncope, prior history of anemia, and irritable bowel syndrome, who reports a long-standing history of excessive daytime somnolence including extended sleep times. She was seen a year ago or so for sleepiness by a sleep specialist but was not advised to pursue any sleep study testing, sleepiness at the time could have been secondary to an antidepressant medication, Lexapro at the time she feels. She has trouble maintaining sleep. She wakes up in a panic sometimes and from vivid dreams but does not recall her dreams very well. She has been sleepy for years. She feels that when she is active and upright and walking about she does not tend to fall asleep inadvertently. She denies any sleep attacks or sleep paralysis or cataplexy. She has a history of syncope, after for example her shower. She denies any telltale hypnagogic or hypnopompic hallucinations but has had fleeting visual images at night. Wake time is currently around 6 AM, bedtime around 9 or 9:30. She recently started working again. 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 She quit smoking 2015, drinks alcohol infrequently for special occasions typically only, caffeine recently one or 2 servings per day. She was given a prescription for BuSpar but has not started taking it yet. She  tried Inderal for anxiety but it did not help very much so she stopped it. She snores a little, does not have any gasping sensations at night and has never been told that she pauses in her breathing at night. She denies morning headaches or night to night nocturia. She is not aware of any family history of OSA or narcolepsy. I reviewed your office note from 12/11/2017. She had extensive lab work on 12/11/2017 and I reviewed the results. Thyroid function was unremarkable, vitamin D level in the normal range, B12 normal, lipid panel normal with LDL at 103, H. pylori negative. Her Epworth sleepiness score is 7 out of 24, fatigue score is 43 out of 63. She leaves that she was more sleepy than her siblings growing up. She is one of 6 children altogether. She was sleepy in high school and college as well.she has a history of sleep talking. She lives at home with her husband and 2 daughters.  Her Past Medical History Is Significant For: Past Medical History:  Diagnosis Date  . Abnormal Pap smear   . Anemia with pregnancies   . Anxiety 12/11/2017  . Dysplastic nevus of skin 12/11/2017  . Hx of varicella   . Irritable bowel syndrome 06/07/2016  . Vitamin D deficiency 12/11/2017    Her Past Surgical History Is Significant For: Past Surgical History:  Procedure Laterality Date  . COLPOSCOPY    . DILATION AND CURETTAGE OF UTERUS    . LEEP    . MINOR HEMORRHOIDECTOMY      Her Family History Is Significant For: Family History  Problem Relation Age of Onset  . Alcohol abuse Mother   . Arthritis Mother   . Depression Mother   . Drug abuse Mother   . Miscarriages / [place] Mother   . Bipolar disorder Mother   . Alcohol abuse Father   . COPD Father   . Depression Sister   . Colon cancer Maternal Aunt   . Ovarian cancer Maternal Aunt   . Hypertension Maternal Grandmother   . Alcohol abuse Sister   . Alcohol abuse Sister     Her Social History Is Significant For: Social History    Socioeconomic History  . Marital status: Married    Spouse name: Not on file  . Number of children: 2  . Years of education: Not on file  . Highest education level: Not on file  Occupational History  . Not on file  Social Needs  . Financial resource strain: Not on file  . Food insecurity:    Worry: Not on file    Inability: Not on file  . Transportation needs:    Medical: Not on file    Non-medical: Not on file  Tobacco Use  . Smoking status: Former Smoker    Last attempt to quit: 12/12/2007    Years since quitting: 10.0  . Smokeless tobacco: Never Used  Substance and Sexual Activity  . Alcohol use: No  . Drug use: No  . Sexual activity: Yes  Lifestyle  . Physical activity:    Days per week: Not on file    Minutes per session: Not on file  . Stress: Not on file  Relationships  . Social connections:    Talks on phone: Not on file    Gets together: Not on file    Attends religious service: Not on file    Active member of club or organization: Not on file    Attends meetings of clubs or organizations: Not on file    Relationship status: Not on file  Other Topics Concern  . Not on file  Social History Narrative  . Not on file    Her Allergies Are:  Allergies  Allergen Reactions  . Penicillins Hives  :   Her Current Medications Are:  Outpatient Encounter Medications as of 01/08/2018  Medication Sig  . azithromycin (ZITHROMAX) 250 MG tablet Take 2 tabs on day 1, then 1 tab daily until finished  . busPIRone (BUSPAR) 7.5 MG tablet Take 2 tablets (15 mg total) by mouth 2 (two) times daily.  . [DISCONTINUED] propranolol (INDERAL) 20 MG tablet Take 1 tablet (20 mg total) by mouth 3 (three) times daily.   No facility-administered encounter medications on file as of 01/08/2018.   :  Review of Systems:  Out of a complete 14 point review of systems, all are reviewed and negative with the exception of these symptoms as listed below: Review of Systems  Neurological:        Pt presents today to discuss her sleep. Pt has never had a sleep study but does endorse snoring.  Epworth Sleepiness Scale 0= would never doze 1= slight chance of dozing 2= moderate chance of dozing 3= high chance of dozing  Sitting and reading: 2 Watching TV: 1 Sitting inactive in a public place (ex. Theater or meeting): 1 As a passenger in a car for an hour without a break: 1 Lying down to rest in the afternoon: 1 Sitting and talking to someone: 0 Sitting quietly after lunch (no alcohol): 1 In a car, while stopped  in traffic: 0 Total: 7     Objective:  Neurological Exam  Physical Exam Physical Examination:   Vitals:   01/08/18 1553  BP: 112/63  Pulse: 76    General Examination: The patient is a very pleasant 32 y.o. female in no acute distress. She appears well-developed and well-nourished and well groomed. Mildly anxious appearing.  HEENT: Normocephalic, atraumatic, pupils are equal, round and reactive to light and accommodation. Extraocular tracking is good without limitation to gaze excursion or nystagmus noted. Normal smooth pursuit is noted. Hearing is grossly intact. Face is symmetric with normal facial animation and normal facial sensation. Speech is clear with no dysarthria noted. There is no hypophonia. There is no lip, neck/head, jaw or voice tremor. Neck is supple with full range of passive and active motion. There are no carotid bruits on auscultation. Oropharynx exam reveals: mild mouth dryness, adequate dental hygiene and no significant airway crowding. Neck circumference is 12-7/8 inches. Tongue protrudes centrally and palate elevates symmetrically.  Chest: Clear to auscultation without wheezing, rhonchi or crackles noted.  Heart: S1+S2+0, regular and normal without murmurs, rubs or gallops noted.   Abdomen: Soft, non-tender and non-distended with normal bowel sounds appreciated on auscultation.  Extremities: There is no pitting edema in the distal lower  extremities bilaterally.   Skin: Warm and dry without trophic changes noted.  Musculoskeletal: exam reveals no obvious joint deformities, tenderness or joint swelling or erythema.   Neurologically:  Mental status: The patient is awake, alert and oriented in all 4 spheres. Her immediate and remote memory, attention, language skills and fund of knowledge are appropriate. There is no evidence of aphasia, agnosia, apraxia or anomia. Speech is clear with normal prosody and enunciation. Thought process is linear. Mood is normal and affect is normal.  Cranial nerves II - XII are as described above under HEENT exam. In addition: shoulder shrug is normal with equal shoulder height noted. Motor exam: Normal bulk, strength and tone is noted. There is no drift, tremor or rebound. Romberg is negative. Reflexes are 2+ to 3+ throughout. Fine motor skills and coordination: intact with normal finger taps, normal hand movements, normal rapid alternating patting, normal foot taps and normal foot agility.  Cerebellar testing: No dysmetria or intention tremor. There is no truncal or gait ataxia.  Sensory exam: intact to light touch.  Gait, station and balance: She stands easily. No veering to one side is noted. No leaning to one side is noted. Posture is age-appropriate and stance is narrow based. Gait shows normal stride length and normal pace. No problems turning are noted. Tandem walk is unremarkable.  Assessment and Plan:  In summary, MYLO DRISKILL is a very pleasant 32 y.o.-year old female with an underlying medical history of vitamin D deficiency, reflux disease, depression, history of vertigo, allergies, history of syncope, prior history of anemia, and irritable bowel syndrome, who Presents for evaluation of her sleep disorder in particular is significant daytime somnolence for several years. Her history and physical exam are not telltale for obstructive sleep apnea. She may have an underlying hypersomnolence  disorder with the main differential diagnosis of narcolepsy without cataplexy and idiopathic hypersomnolence, of course also nonspecific hypersomnia possible. She has a history of anxiety. She was given a prescription for buspirone but has not started it yet. She does not give any telltale symptoms for cataplexy but has had syncopal events in the past. Her physical exam is nonfocal, in particular neurological exam is reassuring. Nevertheless, I do believe there is  a significant sleepiness disorder. I would like to proceed with sleep study testing in the form of nocturnal polysomnogram followed by a next a nap study. She is advised to forego starting the BuSpar at this time until after sleep study testing is completed. She is furthermore advised to keep her sleep schedule stable and not increase or drastically changed her caffeine intake. She is advised not to start any other stimulants her psychotropic medications. I will see her back after the sleep study testing is completed. We talked briefly about sleep apnea and treatment options for sleep apnea if the need arises. I answered all her questions today and the patient was in agreement with the plan.  Thank you very much for allowing me to participate in the care of this nice patient. If I can be of any further assistance to you please do not hesitate to call me at [phone].  Sincerely,   Star Age, MD, PhD

## 2018-01-09 ENCOUNTER — Encounter: Payer: Self-pay | Admitting: Family Medicine

## 2018-01-09 DIAGNOSIS — R400 Somnolence: Secondary | ICD-10-CM | POA: Insufficient documentation

## 2018-02-06 ENCOUNTER — Encounter: Payer: Self-pay | Admitting: Family Medicine

## 2018-02-06 ENCOUNTER — Other Ambulatory Visit (HOSPITAL_COMMUNITY)
Admission: RE | Admit: 2018-02-06 | Discharge: 2018-02-06 | Disposition: A | Discharge status: Home / Self Care | Payer: 59 | Source: Ambulatory Visit | Attending: Family Medicine | Admitting: Family Medicine

## 2018-02-06 ENCOUNTER — Telehealth: Payer: Self-pay

## 2018-02-06 ENCOUNTER — Ambulatory Visit: Payer: 59 | Admitting: Family Medicine

## 2018-02-06 VITALS — BP 110/64 | HR 88 | Temp 97.5°F | Wt 127.2 lb

## 2018-02-06 DIAGNOSIS — N898 Other specified noninflammatory disorders of vagina: Secondary | ICD-10-CM

## 2018-02-06 DIAGNOSIS — Z23 Encounter for immunization: Secondary | ICD-10-CM | POA: Diagnosis not present

## 2018-02-06 DIAGNOSIS — F339 Major depressive disorder, recurrent, unspecified: Secondary | ICD-10-CM

## 2018-02-06 DIAGNOSIS — G4710 Hypersomnia, unspecified: Secondary | ICD-10-CM

## 2018-02-06 LAB — URINALYSIS, ROUTINE W REFLEX MICROSCOPIC
Bilirubin Urine: NEGATIVE
Hgb urine dipstick: NEGATIVE
Ketones, ur: NEGATIVE
Leukocytes, UA: NEGATIVE
Nitrite: NEGATIVE
RBC / HPF: NONE SEEN (ref 0–?)
Specific Gravity, Urine: <=1.005 — AB (ref 1.000–1.030)
Total Protein, Urine: NEGATIVE
Urine Glucose: NEGATIVE
Urobilinogen, UA: 0.2 (ref 0.0–1.0)
pH: 6.5 (ref 5.0–8.0)

## 2018-02-06 MED ORDER — FLUOXETINE HCL 20 MG PO TABS: 20.0000 mg | ORAL_TABLET | Freq: Every day | ORAL | 3 refills | Status: DC

## 2018-02-06 MED ORDER — NORGESTIMATE-ETH ESTRADIOL 0.25-35 MG-MCG PO TABS: ORAL_TABLET | ORAL | 3 refills | Status: DC

## 2018-02-06 NOTE — Telephone Encounter (Signed)
[employer] denied in lab sleep study, need HST order 

## 2018-02-06 NOTE — Progress Notes (Signed)
Victoria York is a 32 y.o. female is here for follow up.  History of Present Illness:   HPI: Depression  Depression symptoms: depressed mood, loss of interests/pleasure, change in sleep, loss of energy, trouble concentrating Current psychosocial stressors include: father in law recently had MI, husband travels often, taking care of daughters, lack of sleep.  Treatment to date has included SSRI, Buspar, Inderal. Symptoms have been unchanged since onset of treatment.  Side effects from the treatment include: drowsiness and nausea.   Alcohol use: no.  Drug use: no.  Exercise: yes.   Patient denies current suicidal and homicidal ideation.  There are no preventive care reminders to display for this patient.   Depression screen PHQ 2/9 12/11/2017  Decreased Interest 2  Down, Depressed, Hopeless 0  PHQ - 2 Score 2  Altered sleeping 1  Tired, decreased energy 1  Change in appetite 3  Feeling bad or failure about yourself  1  Trouble concentrating 3  Moving slowly or fidgety/restless 0  Suicidal thoughts 0  PHQ-9 Score 11  Difficult doing work/chores Somewhat difficult   PMHx, SurgHx, SocialHx, FamHx, Medications, and Allergies were reviewed in the Visit Navigator and updated as appropriate.   Patient Active Problem List   Diagnosis Date Noted  . Daytime somnolence 01/09/2018  . Dysplastic nevus of skin 12/11/2017  . Anxiety 12/11/2017  . Vitamin D deficiency 12/11/2017  . 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 Irritable bowel syndrome 06/07/2016  . Posterior rhinorrhea 06/07/2016   Social History   Tobacco Use  . Smoking status: Former Smoker    Last attempt to quit: 12/12/2007    Years since quitting: 10.1  . Smokeless tobacco: Never Used  Substance Use Topics  . Alcohol use: No  . Drug use: No   Current Medications and Allergies:   .  None   Allergies  Allergen Reactions  . Penicillins Hives   Review of Systems   Pertinent items are noted in the HPI. Otherwise,  ROS is negative.  Vitals:   Vitals:   02/06/18 1517  BP: 110/64  Pulse: 88  Temp: (!) 97.5 F (36.4 C)  TempSrc: Oral  SpO2: 99%  Weight: 127 lb 3.2 oz (57.7 kg)     Body mass index is 21.17 kg/m.  Physical Exam:   Physical Exam  Constitutional: She appears well-nourished.  HENT:  Head: Normocephalic and atraumatic.  Eyes: Pupils are equal, round, and reactive to light. EOM are normal.  Neck: Normal range of motion. Neck supple.  Cardiovascular: Normal rate, regular rhythm, normal heart sounds and intact distal pulses.  Pulmonary/Chest: Effort normal.  Abdominal: Soft.  Skin: Skin is warm.  Psychiatric: She has a normal mood and affect. Her behavior is normal.  Nursing note and vitals reviewed.   Assessment and Plan:   [name] was seen today for follow-up.  Diagnoses and all orders for this visit:  Vaginal discharge -     Cervicovaginal ancillary only -     Urinalysis, Routine w reflex microscopic  Need for immunization against influenza -     Flu Vaccine QUAD 36+ mos IM  Depression, recurrent (HCC) -     FLUoxetine (PROZAC) 20 MG tablet; Take 1 tablet (20 mg total) by mouth daily. -     norgestimate-ethinyl estradiol (SPRINTEC 28) 0.25-35 MG-MCG tablet; Take continuously for three months    . Reviewed expectations re: course of current medical issues. . Discussed self-management of symptoms. . Outlined signs and symptoms indicating need for more  acute intervention. . Patient verbalized understanding and all questions were answered. Marland Kitchen Health Maintenance issues including appropriate healthy diet, exercise, and smoking avoidance were discussed with patient. . See orders for this visit as documented in the electronic medical record. . Patient received an After Visit Summary.   Briscoe Deutscher, DO [place] 02/14/2018

## 2018-02-06 NOTE — Telephone Encounter (Signed)
VO for HST from Dr. Athar received. HST order placed.  

## 2018-02-07 LAB — CERVICOVAGINAL ANCILLARY ONLY
Bacterial vaginitis: NEGATIVE
Candida vaginitis: NEGATIVE

## 2018-02-13 ENCOUNTER — Ambulatory Visit: Payer: 59 | Admitting: Family Medicine

## 2018-02-13 ENCOUNTER — Encounter: Payer: Self-pay | Admitting: Family Medicine

## 2018-02-13 ENCOUNTER — Ambulatory Visit (INDEPENDENT_AMBULATORY_CARE_PROVIDER_SITE_OTHER): Payer: 59

## 2018-02-13 VITALS — BP 110/68 | HR 83 | Temp 98.5°F | Ht 65.0 in | Wt 127.4 lb

## 2018-02-13 DIAGNOSIS — R05 Cough: Secondary | ICD-10-CM | POA: Diagnosis not present

## 2018-02-13 DIAGNOSIS — R059 Cough, unspecified: Secondary | ICD-10-CM

## 2018-02-13 DIAGNOSIS — R110 Nausea: Secondary | ICD-10-CM | POA: Diagnosis not present

## 2018-02-13 LAB — POCT URINE PREGNANCY: Preg Test, Ur: NEGATIVE

## 2018-02-13 MED ORDER — SUCRALFATE 1 G PO TABS
1.0000 g | ORAL_TABLET | Freq: Three times a day (TID) | ORAL | 0 refills | Status: DC
Start: 2018-02-13 — End: 2018-03-11

## 2018-02-13 MED ORDER — HYDROCODONE-HOMATROPINE 5-1.5 MG/5ML PO SYRP
5.0000 mL | ORAL_SOLUTION | Freq: Every evening | ORAL | 0 refills | Status: DC | PRN
Start: 2018-02-13 — End: 2018-03-11

## 2018-02-13 MED ORDER — OMEPRAZOLE 40 MG PO CPDR: 40.0000 mg | DELAYED_RELEASE_CAPSULE | Freq: Every day | ORAL | 3 refills | Status: DC

## 2018-02-14 ENCOUNTER — Encounter: Payer: Self-pay | Admitting: Family Medicine

## 2018-02-14 NOTE — Progress Notes (Signed)
   Victoria York is a 32 y.o. female here for an acute visit.  History of Present Illness:   HPI: Acute onset, 2-3 Hx of sore throat, chest burning, nausea. Worse with lying down. No treatment. Recent medication change - addition of Prozac. Also noting runny nose, nasal congestion, mild PND, and nonproductive cough. No fever but experiencing chills.   PMHx, SurgHx, SocialHx, Medications, and Allergies were reviewed in the Visit Navigator and updated as appropriate.  Current Medications:   .  FLUoxetine (PROZAC) 20 MG tablet, Take 1 tablet (20 mg total) by mouth daily., Disp: 30 tablet, Rfl: 3 .  norgestimate-ethinyl estradiol (SPRINTEC 28) 0.25-35 MG-MCG tablet, Take continuously for three months, Disp: 4 Package, Rfl: 3  Allergies  Allergen Reactions  . Penicillins Hives   Review of Systems:   Pertinent items are noted in the HPI. Otherwise, ROS is negative.  Vitals:   Vitals:   02/13/18 1525  BP: 110/68  Pulse: 83  Temp: 98.5 F (36.9 C)  TempSrc: Oral  SpO2: 98%  Weight: 127 lb 6.4 oz (57.8 kg)  Height: 5\' 5"  (1.651 m)     Body mass index is 21.2 kg/m.  Physical Exam:   Physical Exam  Constitutional: She appears well-nourished.  HENT:  Head: Normocephalic and atraumatic.  Eyes: Pupils are equal, round, and reactive to light. EOM are normal.  Neck: Normal range of motion. Neck supple.  Cardiovascular: Normal rate, regular rhythm, normal heart sounds and intact distal pulses.  Pulmonary/Chest: Effort normal.  Abdominal: Soft.  Skin: Skin is warm.  Psychiatric: She has a normal mood and affect. Her behavior is normal.  Nursing note and vitals reviewed.    Results for orders placed or performed in visit on 02/13/18  POCT urine pregnancy  Result Value Ref Range   Preg Test, Ur Negative Negative   Chest xray without acute changes.   Assessment and Plan:   Victoria York was seen today for sore throat. I believe that this is multifactorial. Likely allergic rhinitis  with PND versus URI. However, she may have some gastritis +/- nausea from new SSRI. Discussed all possibilities. Will address dyspepsia as likely major issue. CXR reassuring. Will check in over the next week.   Diagnoses and all orders for this visit:  Cough -     DG Chest 2 View -     POCT urine pregnancy -     HYDROcodone-homatropine (HYCODAN) 5-1.5 MG/5ML syrup; Take 5 mLs by mouth at bedtime as needed for cough.  Nausea -     POCT urine pregnancy -     omeprazole (PRILOSEC) 40 MG capsule; Take 1 capsule (40 mg total) by mouth daily. -     sucralfate (CARAFATE) 1 g tablet; Take 1 tablet (1 g total) by mouth 4 (four) times daily -  with meals and at bedtime.  . Reviewed expectations re: course of current medical issues. . Discussed self-management of symptoms. . Outlined signs and symptoms indicating need for more acute intervention. . Patient verbalized understanding and all questions were answered. Marland Kitchen Health Maintenance issues including appropriate healthy diet, exercise, and smoking avoidance were discussed with patient. . See orders for this visit as documented in the electronic medical record. . Patient received an After Visit Summary.  Briscoe Deutscher, DO [place] 02/14/2018

## 2018-02-15 ENCOUNTER — Ambulatory Visit: Payer: 59

## 2018-03-02 ENCOUNTER — Other Ambulatory Visit: Payer: Self-pay | Admitting: Surgical

## 2018-03-02 ENCOUNTER — Encounter: Payer: Self-pay | Admitting: Family Medicine

## 2018-03-02 MED ORDER — FLUOXETINE HCL 20 MG PO CAPS: 20.0000 mg | ORAL_CAPSULE | Freq: Every day | ORAL | 3 refills | Status: DC

## 2018-03-04 ENCOUNTER — Ambulatory Visit (INDEPENDENT_AMBULATORY_CARE_PROVIDER_SITE_OTHER): Payer: 59 | Admitting: Neurology

## 2018-03-04 DIAGNOSIS — G4719 Other hypersomnia: Secondary | ICD-10-CM

## 2018-03-04 DIAGNOSIS — G4710 Hypersomnia, unspecified: Secondary | ICD-10-CM

## 2018-03-11 ENCOUNTER — Encounter: Payer: Self-pay | Admitting: Physician Assistant

## 2018-03-11 ENCOUNTER — Telehealth: Payer: Self-pay

## 2018-03-11 ENCOUNTER — Other Ambulatory Visit (HOSPITAL_COMMUNITY)
Admission: RE | Admit: 2018-03-11 | Discharge: 2018-03-11 | Disposition: A | Discharge status: Home / Self Care | Payer: 59 | Source: Ambulatory Visit | Attending: Physician Assistant | Admitting: Physician Assistant

## 2018-03-11 ENCOUNTER — Telehealth: Payer: Self-pay | Admitting: Family Medicine

## 2018-03-11 ENCOUNTER — Ambulatory Visit: Payer: 59 | Admitting: Physician Assistant

## 2018-03-11 VITALS — BP 110/68 | HR 76 | Temp 98.5°F | Ht 65.0 in | Wt 128.5 lb

## 2018-03-11 DIAGNOSIS — N939 Abnormal uterine and vaginal bleeding, unspecified: Secondary | ICD-10-CM | POA: Diagnosis not present

## 2018-03-11 DIAGNOSIS — R1030 Lower abdominal pain, unspecified: Secondary | ICD-10-CM | POA: Diagnosis not present

## 2018-03-11 LAB — CBC WITH DIFFERENTIAL/PLATELET
BASOS ABS: 0.1 10*3/uL (ref 0.0–0.1)
Basophils Relative: 0.7 % (ref 0.0–3.0)
EOS PCT: 0.3 % (ref 0.0–5.0)
Eosinophils Absolute: 0 10*3/uL (ref 0.0–0.7)
HEMATOCRIT: 37.3 % (ref 36.0–46.0)
Hemoglobin: 12.9 g/dL (ref 12.0–15.0)
LYMPHS PCT: 16.3 % (ref 12.0–46.0)
Lymphs Abs: 1.7 10*3/uL (ref 0.7–4.0)
MCHC: 34.4 g/dL (ref 30.0–36.0)
MCV: 83.6 fl (ref 78.0–100.0)
MONOS PCT: 4.4 % (ref 3.0–12.0)
Monocytes Absolute: 0.4 10*3/uL (ref 0.1–1.0)
NEUTROS ABS: 8 10*3/uL — AB (ref 1.4–7.7)
NEUTROS PCT: 78.3 % — AB (ref 43.0–77.0)
Platelets: 272 10*3/uL (ref 150.0–400.0)
RBC: 4.46 Mil/uL (ref 3.87–5.11)
RDW: 12.9 % (ref 11.5–15.5)
WBC: 10.2 10*3/uL (ref 4.0–10.5)

## 2018-03-11 LAB — POCT URINALYSIS DIPSTICK
BILIRUBIN UA: NEGATIVE
GLUCOSE UA: NEGATIVE
KETONES UA: NEGATIVE
Leukocytes, UA: NEGATIVE
NITRITE UA: NEGATIVE
PROTEIN UA: NEGATIVE
Spec Grav, UA: 1.01 (ref 1.010–1.025)
Urobilinogen, UA: 0.2 E.U./dL
pH, UA: 6 (ref 5.0–8.0)

## 2018-03-11 LAB — POCT URINE PREGNANCY: Preg Test, Ur: NEGATIVE

## 2018-03-11 LAB — HCG, QUANTITATIVE, PREGNANCY: Quantitative HCG: <0.6 m[IU]/mL

## 2018-03-11 NOTE — Progress Notes (Signed)
Pt had neg HST for OSA. I originally wanted for her to have PSG/MSLT. Please resubmit auth.  Michel Bickers

## 2018-03-11 NOTE — Progress Notes (Signed)
Victoria York is a 32 y.o. female here for a new problem.  I acted as a Education administrator for [employer], PA-C Anselmo Pickler, LPN  History of Present Illness:   Chief Complaint  Patient presents with  . Vaginal Bleeding    x 2-3 days    Vaginal Bleeding  The patient's primary symptoms include vaginal bleeding. This is a new problem. Episode onset: Started 3 days ago. Episode frequency: Pt started OC's 5 weeks ago and 2 weeks ago started brown spotting and then 3 days ago started red vaginal bleeding light to moderate flow. The problem has been waxing and waning. The pain is moderate. The problem affects both sides. Associated symptoms include abdominal pain (cramping), back pain (low back), constipation and nausea. Pertinent negatives include no chills, fever or vomiting. Associated symptoms comments: Pt states yesterday she felt like she was having contractions every 10 minutes and passed tissue last night.. The vaginal discharge was bloody. Vaginal bleeding amount: Ligh to moderate flow. She has not been passing clots. She has been passing tissue. Nothing aggravates the symptoms. She has tried NSAIDs for the symptoms. The treatment provided moderate relief. She is sexually active. She uses oral contraceptives for contraception. Her menstrual history has been irregular.   Last night and then again this morning she passed a large "piece of tissue". She had relief of symptoms after she passed this. Also having white discharge. Denies concerns for STDs. Does have a history of a "growth on my cervix" that was "trimmed" by her ob-gyn soon after her first child, who is currently 62 y/o. Sister with significant history of dermoid cysts that required hysterectomy.  She denies any concern for STD at this time.      Past Medical History:  Diagnosis Date  . Abnormal Pap smear   . Anemia with pregnancies   . Anxiety 12/11/2017  . Dysplastic nevus of skin 12/11/2017  . Hx of varicella   . Irritable bowel  syndrome 06/07/2016  . Vitamin D deficiency 12/11/2017     Social History   Socioeconomic History  . Marital status: Married    Spouse name: Not on file  . Number of children: 2  . Years of education: Not on file  . Highest education level: Not on file  Occupational History  . Not on file  Social Needs  . Financial resource strain: Not on file  . Food insecurity:    Worry: Not on file    Inability: Not on file  . Transportation needs:    Medical: Not on file    Non-medical: Not on file  Tobacco Use  . Smoking status: Former Smoker    Last attempt to quit: 12/12/2007    Years since quitting: 10.2  . Smokeless tobacco: Never Used  Substance and Sexual Activity  . Alcohol use: No  . Drug use: No  . Sexual activity: Yes  Lifestyle  . Physical activity:    Days per week: Not on file    Minutes per session: Not on file  . Stress: Not on file  Relationships  . Social connections:    Talks on phone: Not on file    Gets together: Not on file    Attends religious service: Not on file    Active member of club or organization: Not on file    Attends meetings of clubs or organizations: Not on file    Relationship status: Not on file  . Intimate partner violence:    Fear of  current or ex partner: Not on file    Emotionally abused: Not on file    Physically abused: Not on file    Forced sexual activity: Not on file  Other Topics Concern  . Not on file  Social History Narrative  . Not on file    Past Surgical History:  Procedure Laterality Date  . COLPOSCOPY    . DILATION AND CURETTAGE OF UTERUS    . LEEP    . MINOR HEMORRHOIDECTOMY      Family History  Problem Relation Age of Onset  . Alcohol abuse Mother   . Arthritis Mother   . Depression Mother   . Drug abuse Mother   . Miscarriages / [place] Mother   . Bipolar disorder Mother   . Alcohol abuse Father   . COPD Father   . Depression Sister   . Colon cancer Maternal Aunt   . Ovarian cancer Maternal Aunt    . Hypertension Maternal Grandmother   . Alcohol abuse Sister   . Alcohol abuse Sister     Allergies  Allergen Reactions  . Penicillins Hives    Current Medications:   Current Outpatient Medications:  .  FLUoxetine (PROZAC) 20 MG capsule, Take 1 capsule (20 mg total) by mouth daily., Disp: 30 capsule, Rfl: 3 .  norgestimate-ethinyl estradiol (SPRINTEC 28) 0.25-35 MG-MCG tablet, Take continuously for three months, Disp: 4 Package, Rfl: 3 .  omeprazole (PRILOSEC) 40 MG capsule, Take 1 capsule (40 mg total) by mouth daily., Disp: 30 capsule, Rfl: 3   Review of Systems:   Review of Systems  Constitutional: Negative for chills and fever.  Gastrointestinal: Positive for abdominal pain (cramping), constipation and nausea. Negative for vomiting.  Genitourinary: Positive for vaginal bleeding.  Musculoskeletal: Positive for back pain (low back).    Vitals:   Vitals:   03/11/18 1412  BP: 110/68  Pulse: 76  Temp: 98.5 F (36.9 C)  TempSrc: Oral  SpO2: 98%  Weight: 128 lb 8 oz (58.3 kg)  Height: 5\' 5"  (1.651 m)     Body mass index is 21.38 kg/m.  Physical Exam:   Physical Exam  Constitutional: She appears well-developed. She is cooperative.  Non-toxic appearance. She does not have a sickly appearance. She does not appear ill. No distress.  Cardiovascular: Normal rate, regular rhythm, S1 normal, S2 normal, normal heart sounds and normal pulses.  No LE edema  Pulmonary/Chest: Effort normal and breath sounds normal.  Abdominal: Normal appearance and bowel sounds are normal. There is generalized tenderness. There is no rigidity, no rebound, no guarding and no CVA tenderness.  Genitourinary: Vagina normal. There is no rash, tenderness or lesion on the right labia. There is no rash, tenderness or lesion on the left labia. Cervix exhibits discharge (white). Cervix exhibits no motion tenderness. No tenderness in the vagina.  Genitourinary Comments: Small clot of blood x 1  -- size of  pea roughly in vaginal vault  Neurological: She is alert. GCS eye subscore is 4. GCS verbal subscore is 5. GCS motor subscore is 6.  Skin: Skin is warm, dry and intact.  Psychiatric: She has a normal mood and affect. Her speech is normal and behavior is normal.  Nursing note and vitals reviewed.  See image below of tissue that patient provided that she passed yesterday and today:    Results for orders placed or performed in visit on 03/11/18  B-HCG Quant  Result Value Ref Range   Quantitative HCG <0.60 mIU/ml  CBC with  Differential/Platelet  Result Value Ref Range   WBC 10.2 4.0 - 10.5 K/uL   RBC 4.46 3.87 - 5.11 Mil/uL   Hemoglobin 12.9 12.0 - 15.0 g/dL   HCT 37.3 36.0 - 46.0 %   MCV 83.6 78.0 - 100.0 fl   MCHC 34.4 30.0 - 36.0 g/dL   RDW 12.9 11.5 - 15.5 %   Platelets 272.0 150.0 - 400.0 K/uL   Neutrophils Relative % 78.3 (H) 43.0 - 77.0 %   Lymphocytes Relative 16.3 12.0 - 46.0 %   Monocytes Relative 4.4 3.0 - 12.0 %   Eosinophils Relative 0.3 0.0 - 5.0 %   Basophils Relative 0.7 0.0 - 3.0 %   Neutro Abs 8.0 (H) 1.4 - 7.7 K/uL   Lymphs Abs 1.7 0.7 - 4.0 K/uL   Monocytes Absolute 0.4 0.1 - 1.0 K/uL   Eosinophils Absolute 0.0 0.0 - 0.7 K/uL   Basophils Absolute 0.1 0.0 - 0.1 K/uL  POCT urine pregnancy  Result Value Ref Range   Preg Test, Ur Negative Negative  POCT urinalysis dipstick  Result Value Ref Range   Color, UA Yellow    Clarity, UA Clear    Glucose, UA Negative Negative   Bilirubin, UA Negative    Ketones, UA Negative    Spec Grav, UA 1.010 1.010 - 1.025   Blood, UA Trace    pH, UA 6.0 5.0 - 8.0   Protein, UA Negative Negative   Urobilinogen, UA 0.2 0.2 or 1.0 E.U./dL   Nitrite, UA Negative    Leukocytes, UA Negative Negative   Appearance     Odor       Assessment and Plan:    Adrieana was seen today for vaginal bleeding.  Diagnoses and all orders for this visit:  Lower abdominal pain; Vaginal bleeding, abnormal Urine pregnancy negative. UA with  trace blood. No CMT on exam but does have some discharge -- cervicovaginal swab taken and pending at this time. Continue on oral contraceptives at this time. Will refer to ob-gyn. If symptoms worsen, discussed that we may need to possibly obtain vaginal ultrasound, however if symptoms are significantly worsened, needs to go to [facility]. -     POCT urine pregnancy -     POCT urinalysis dipstick -     Cervicovaginal ancillary only -     B-HCG Quant -     CBC with Differential/Platelet -     Surgical pathology -     Ambulatory referral to Obstetrics / Gynecology   . Reviewed expectations re: course of current medical issues. . Discussed self-management of symptoms. . Outlined signs and symptoms indicating need for more acute intervention. . Patient verbalized understanding and all questions were answered. . See orders for this visit as documented in the electronic medical record. . Patient received an After-Visit Summary.  CMA or LPN served as scribe during this visit. History, Physical, and Plan performed by medical provider. The above documentation has been reviewed and is accurate and complete.  Inda Coke, PA-C

## 2018-03-11 NOTE — Telephone Encounter (Signed)
Pt returned my call and I advised her of her sleep study results. Pt is agreeable to the in lab sleep studies and understands that our sleep lab will call her to get these scheduled. Pt verbalized understanding of results. Pt had no questions at this time but was encouraged to call back if questions arise.

## 2018-03-11 NOTE — Patient Instructions (Signed)
It was great to see you!  We will continue birth control pills.  We will call you with your lab results.  I am going to refer you to ob-gyn.  If symptoms worsen, please go to [place] [facility],  Inda Coke PA-C

## 2018-03-11 NOTE — Telephone Encounter (Signed)
I called pt to discuss her sleep study results. No answer, left a message asking her to call me back. 

## 2018-03-11 NOTE — Telephone Encounter (Signed)
Caller states she is on a new birth control and is not supposed to have a period. But she says she has been bleeding and she also says she had really bad abdominal pain, almost like contractions. She passed the same type- something that looks like tissue.   PER [facility]

## 2018-03-11 NOTE — Telephone Encounter (Signed)
Called patient states that 2 weeks ago she started having some light brown spotting that has progressed to a "light period" She states she had no other symptoms until yesterday when she started having severe "cramping" in her lower abdomen. She took some Motrin and it got better. She did "pass" something yesterday and that is when the symptoms got a lot better. She states that this am when she got up to go to the bathroom she "passed" something that was the same as yesterday and was instructed to bring it in the office with her today. Her pain today is a 3/10 and she describes as a dull cramp. She is still bleeding but states that it is like a lite period she is using tampons and only has to change every 3-4 hrs with no problems. She was started on new ocp about 6 weeks ago and did have unprotected sex with spouse.

## 2018-03-11 NOTE — Telephone Encounter (Signed)
Pt has has appointment with you today at 2

## 2018-03-11 NOTE — Procedures (Signed)
[facility] @[facility] [address] [place], [place] [address] NAME:  Victoria York                                                                DOB: 11-12-1985 MEDICAL RECORD [id number]                                              DOS: 03/04/2018  REFERRING PHYSICIAN: Briscoe Deutscher, DO STUDY PERFORMED: [facility] HISTORY: 32 year old woman with a history of vitamin D deficiency, reflux disease, depression, history of vertigo, allergies, history of syncope, prior history of anemia, and irritable bowel syndrome, who reports a long-standing history of excessive daytime somnolence including extended sleep times. Her Epworth sleepiness score is 7 out of 24, fatigue score is 43 out of 63. BMI is 21.3.  STUDY RESULTS:  Total Recording Time:  9 hours, 18 minutes (valid test time: 8 hours 43 min) Total Apnea/Hypopnea Index (AHI): 0.0/h, RDI: 1.9/h Average Oxygen Saturation: 96%, Lowest Oxygen Desaturation: 85%  Total Time Oxygen Saturation Below or at 88 %: 13.0 minutes  Average Heart Rate: 71 bpm (between 56 and 102 bpm) IMPRESSION: Excessive daytime sleepiness RECOMMENDATION: This home sleep test does not demonstrate any significant obstructive or central sleep disordered breathing. No significant snoring was noted. Other causes of the patient's symptoms, including circadian rhythm disturbances, an underlying mood disorder, medication effect and/or an underlying medical problem including an intrinsic sleepiness disorder, cannot be ruled out based on this test. Clinical correlation is recommended. The patient will be advised to return for attended sleep testing in the form of NPSG/MSLT. Review and reiteration of good sleep hygiene measures should be pursued with any patient. An appointment in sleep clinic will be made after attended sleep testing is completed.  I certify that I have reviewed the raw data recording prior to the issuance of this report in accordance with the  standards of the American Academy of Sleep Medicine (AASM).   Star Age, MD, PhD [facility]) Diplomat, ABPN (Neurology and Sleep)

## 2018-03-11 NOTE — Progress Notes (Signed)
Patient referred by Dr. Juleen China, seen by me on 12/1017, HST on 02/23/18.   Please call and notify the patient that the recent home sleep test did not show any significant obstructive sleep apnea. Patient is advised to proceed with NPSG and next day MSLT, which were denied by insurance. We will re-request testing and should be able to bring her in for attended testing as originally discussed with her.  Thanks,  Star Age, MD, PhD [facility])

## 2018-03-11 NOTE — Telephone Encounter (Signed)
-----   Message from Star Age, MD sent at 03/11/2018  8:32 AM EST ----- Patient referred by Dr. Juleen China, seen by me on 12/1017, HST on 02/23/18.   Please call and notify the patient that the recent home sleep test did not show any significant obstructive sleep apnea. Patient is advised to proceed with NPSG and next day MSLT, which were denied by insurance. We will re-request testing and should be able to bring her in for attended testing as originally discussed with her.  Thanks,  Star Age, MD, PhD [facility])

## 2018-03-11 NOTE — Telephone Encounter (Signed)
Please call patient and triage.

## 2018-03-12 LAB — CERVICOVAGINAL ANCILLARY ONLY
Bacterial vaginitis: NEGATIVE
CANDIDA VAGINITIS: NEGATIVE
Chlamydia: NEGATIVE
NEISSERIA GONORRHEA: NEGATIVE
TRICH (WINDOWPATH): NEGATIVE

## 2018-03-13 ENCOUNTER — Encounter: Payer: Self-pay | Admitting: Obstetrics and Gynecology

## 2018-03-14 NOTE — Telephone Encounter (Signed)
Re-submitted with insurance and waiting on auth.

## 2018-03-19 NOTE — Progress Notes (Signed)
Victoria York is a 32 y.o. female is here for follow up.  History of Present Illness:   Victoria York, CMA acting as scribe for Dr. Briscoe Deutscher.   HPI: Patient in office today for follow up she has started Prozac after last visit. She states she can tell a HUGE improvement with medications. She is still having trouble sleeping though the night. She did have sleep study and they recommended an extended sleep study and they are working on getting approved with insurance.   Rash: on right arm and around back neck. It has been issue off and on for years gets bad during winter time. She has not tried any over the counter treatments.   There are no preventive care reminders to display for this patient.   Depression screen [facility] 2/9 03/20/2018 12/11/2017  Decreased Interest 0 2  Down, Depressed, Hopeless 0 0  PHQ - 2 Score 0 2  Altered sleeping 1 1  Tired, decreased energy 0 1  Change in appetite 0 3  Feeling bad or failure about yourself  0 1  Trouble concentrating 0 3  Moving slowly or fidgety/restless 0 0  Suicidal thoughts 0 0  PHQ-9 Score 1 11  Difficult doing work/chores - Somewhat difficult   PMHx, SurgHx, SocialHx, FamHx, Medications, and Allergies were reviewed in the Visit Navigator and updated as appropriate.   Patient Active Problem List   Diagnosis Date Noted  . Daytime somnolence 01/09/2018  . Dysplastic nevus of skin 12/11/2017  . Anxiety 12/11/2017  . Vitamin D deficiency 12/11/2017  . 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 Irritable bowel syndrome 06/07/2016  . Posterior rhinorrhea 06/07/2016   Social History   Tobacco Use  . Smoking status: Former Smoker    Last attempt to quit: 12/12/2007    Years since quitting: 10.2  . Smokeless tobacco: Never Used  Substance Use Topics  . Alcohol use: No  . Drug use: No   Current Medications and Allergies:   .  FLUoxetine (PROZAC) 20 MG capsule, Take 1 capsule (20 mg total) by mouth daily., Disp: 30 capsule, Rfl: 3 .   norgestimate-ethinyl estradiol (SPRINTEC 28) 0.25-35 MG-MCG tablet, Take continuously for three months, Disp: 4 Package, Rfl: 3 .  omeprazole (PRILOSEC) 40 MG capsule, Take 1 capsule (40 mg total) by mouth daily., Disp: 30 capsule, Rfl: 3   Allergies  Allergen Reactions  . Penicillins Hives   Review of Systems   Pertinent items are noted in the HPI. Otherwise, ROS is negative.  Vitals:  There were no vitals filed for this visit.   There is no height or weight on file to calculate BMI.  Physical Exam:   Physical Exam  Constitutional: She appears well-nourished.  HENT:  Head: Normocephalic and atraumatic.  Eyes: Pupils are equal, round, and reactive to light. EOM are normal.  Neck: Normal range of motion. Neck supple.  Cardiovascular: Normal rate, regular rhythm, normal heart sounds and intact distal pulses.  Pulmonary/Chest: Effort normal.  Abdominal: Soft.  Skin: Skin is warm.  Atopic dermatitis forearms.  Psychiatric: She has a normal mood and affect. Her behavior is normal.  Nursing note and vitals reviewed.  Assessment and Plan:   Victoria York was seen today for follow-up.  Diagnoses and all orders for this visit:  Anxiety Comments: Improved with current treatment.   Intrinsic atopic dermatitis -     triamcinolone (KENALOG) 0.025 % ointment; Apply 1 application topically 2 (two) times daily.  Irritable bowel syndrome, unspecified type -  dicyclomine (BENTYL) 10 MG capsule; Take 1 capsule (10 mg total) by mouth 4 (four) times daily -  before meals and at bedtime.   . Reviewed expectations re: course of current medical issues. . Discussed self-management of symptoms. . Outlined signs and symptoms indicating need for more acute intervention. . Patient verbalized understanding and all questions were answered. Marland Kitchen Health Maintenance issues including appropriate healthy diet, exercise, and smoking avoidance were discussed with patient. . See orders for this visit as  documented in the electronic medical record. . Patient received an After Visit Summary.  CMA served as Education administrator during this visit. History, Physical, and Plan performed by medical provider. The above documentation has been reviewed and is accurate and complete. Briscoe Deutscher, D.O.  Briscoe Deutscher, DO [place], [place] [facility] 03/21/2018

## 2018-03-20 ENCOUNTER — Ambulatory Visit: Payer: 59 | Admitting: Family Medicine

## 2018-03-20 DIAGNOSIS — L2084 Intrinsic (allergic) eczema: Secondary | ICD-10-CM

## 2018-03-20 DIAGNOSIS — F419 Anxiety disorder, unspecified: Secondary | ICD-10-CM

## 2018-03-20 DIAGNOSIS — K589 Irritable bowel syndrome without diarrhea: Secondary | ICD-10-CM | POA: Diagnosis not present

## 2018-03-20 MED ORDER — DICYCLOMINE HCL 10 MG PO CAPS: 10.0000 mg | ORAL_CAPSULE | Freq: Three times a day (TID) | ORAL | 1 refills | Status: DC

## 2018-03-21 ENCOUNTER — Encounter: Payer: Self-pay | Admitting: Family Medicine

## 2018-03-21 DIAGNOSIS — L2084 Intrinsic (allergic) eczema: Secondary | ICD-10-CM | POA: Insufficient documentation

## 2018-03-21 MED ORDER — TRIAMCINOLONE ACETONIDE 0.025 % EX OINT: 1.0000 "application " | TOPICAL_OINTMENT | Freq: Two times a day (BID) | CUTANEOUS | 0 refills | Status: DC

## 2018-03-24 ENCOUNTER — Other Ambulatory Visit: Payer: Self-pay | Admitting: Family Medicine

## 2018-03-24 DIAGNOSIS — R110 Nausea: Secondary | ICD-10-CM

## 2018-04-04 ENCOUNTER — Ambulatory Visit: Payer: 59 | Admitting: Obstetrics and Gynecology

## 2018-04-04 ENCOUNTER — Encounter: Payer: Self-pay | Admitting: Obstetrics and Gynecology

## 2018-04-04 VITALS — BP 110/80 | HR 68 | Ht 65.5 in | Wt 131.8 lb

## 2018-04-04 DIAGNOSIS — F3281 Premenstrual dysphoric disorder: Secondary | ICD-10-CM | POA: Diagnosis not present

## 2018-04-04 DIAGNOSIS — N921 Excessive and frequent menstruation with irregular cycle: Secondary | ICD-10-CM | POA: Diagnosis not present

## 2018-04-04 DIAGNOSIS — N939 Abnormal uterine and vaginal bleeding, unspecified: Secondary | ICD-10-CM | POA: Diagnosis not present

## 2018-04-04 LAB — POCT URINE PREGNANCY: Preg Test, Ur: NEGATIVE

## 2018-04-04 NOTE — Patient Instructions (Signed)
Abnormal Uterine Bleeding Abnormal uterine bleeding can affect women at various stages in life, including teenagers, women in their reproductive years, pregnant women, and women who have reached menopause. Several kinds of uterine bleeding are considered abnormal, including:  Bleeding or spotting between periods.  Bleeding after sexual intercourse.  Bleeding that is heavier or more than normal.  Periods that last longer than usual.  Bleeding after menopause. Many cases of abnormal uterine bleeding are minor and simple to treat, while others are more serious. Any type of abnormal bleeding should be evaluated by your health care provider. Treatment will depend on the cause of the bleeding. Follow these instructions at home: Monitor your condition for any changes. The following actions may help to alleviate any discomfort you are experiencing:  Avoid the use of tampons and douches as directed by your health care provider.  Change your pads frequently. You should get regular pelvic exams and Pap tests. Keep all follow-up appointments for diagnostic tests as directed by your health care provider. Contact a health care provider if:  Your bleeding lasts more than 1 week.  You feel dizzy at times. Get help right away if:  You pass out.  You are changing pads every 15 to 30 minutes.  You have abdominal pain.  You have a fever.  You become sweaty or weak.  You are passing large blood clots from the vagina.  You start to feel nauseous and vomit. This information is not intended to replace advice given to you by your health care provider. Make sure you discuss any questions you have with your health care provider. Document Released: 04/17/2005 Document Revised: 09/29/2015 Document Reviewed: 11/14/2012 Elsevier Interactive Patient Education  2017 [employer]  

## 2018-04-04 NOTE — Progress Notes (Signed)
32 y.o. [id number] Married White or Caucasian Not Hispanic or Latino female here for a consultation from Dr Juleen China for irregular bleeding. Reports she was started on birth control pills for PMDD. Since starting pill she has had irregular bleeding and cramping. Reports passing a large piece of tissue and was sent off for pathology by her PCP and showed BENIGN POLYPOID ENDOMETRIUM WITH PROMINENT HORMONE EFFECT. - NO EVIDENCE OF HYPERPLASIA OR MALIGNANCY (see results in Epic).   She started on OCP's a couple of months ago for PMDD. It is helping the PMDD.  Prior to the pill her cycles were every 3-5 weeks x 5-7 days. She could saturate a super tampon in 2-3 hours. Cramps were manageable.  Her last cycle prior to starting the pill was abnormally light. She has had a recent normal TSH, denies galactorrhea.   She started on the pill continuously ~2.5 months ago. The first month, no bleeding, she skipped the placebo pills. The second week of the 2nd pack she started spotting and bleeding, she bleed for 2 weeks. She mostly had light flow. During the 2 weeks of bleeding she started having bad cramping, passed a large piece of tissue (see above pathology). She skipped the placebo pills again and is in the middle of her 3rd pack and hasn't had any further bleeding.  Prior to restarting the pill she was using w/d for contraception.  She had normal TSH in 8/19. In 11/19 not anemic, negative UPT (same day as the specimen).  Period Duration (Days): 14 days in November, prior to that were lasting 5-7 days Period Pattern: (!) Irregular Menstrual Flow: Light, Moderate Menstrual Control: Tampon Menstrual Control Change Freq (Hours): changes tampon every 2-4 hours Dysmenorrhea: (!) Severe Dysmenorrhea Symptoms: Cramping  Patient's last menstrual period was 03/23/2018 (exact date).          Sexually active: Yes.    The current method of family planning is OCP (estrogen/progesterone).    Exercising: No.  The patient  does not participate in regular exercise at present. Smoker:  no  Health Maintenance: Pap:  12/17/2017 WNL NEG HR HPV History of abnormal Pap:  Yes, 10 years ago, had colposcopy, paps have been normal since Colonoscopy: 2017 WNL TDaP: Up to date Gardasil: Completed all 3   reports that she quit smoking about 10 years ago. She has never used smokeless tobacco. She reports that she does not drink alcohol or use drugs.  Past Medical History:  Diagnosis Date  . Abnormal Pap smear   . Abnormal uterine bleeding   . Anemia with pregnancies   . Anxiety 12/11/2017  . Depression   . Dysmenorrhea   . Dysplastic nevus of skin 12/11/2017  . Hormone disorder   . Hx of varicella   . Irritable bowel syndrome 06/07/2016  . Vitamin D deficiency 12/11/2017    Past Surgical History:  Procedure Laterality Date  . COLPOSCOPY    . DILATION AND CURETTAGE OF UTERUS    . LEEP    . MINOR HEMORRHOIDECTOMY      Current Outpatient Medications  Medication Sig Dispense Refill  . FLUoxetine (PROZAC) 20 MG capsule Take 1 capsule (20 mg total) by mouth daily. 30 capsule 3  . norgestimate-ethinyl estradiol (SPRINTEC 28) 0.25-35 MG-MCG tablet Take continuously for three months 4 Package 3  . omeprazole (PRILOSEC) 40 MG capsule Take 1 capsule (40 mg total) by mouth daily. 30 capsule 3  . dicyclomine (BENTYL) 10 MG capsule Take 1 capsule (10 mg total) by mouth  4 (four) times daily -  before meals and at bedtime. (Patient not taking: Reported on 04/04/2018) 60 capsule 1  . sucralfate (CARAFATE) 1 g tablet TAKE 1 TABLET (1 G TOTAL) BY MOUTH 4 (FOUR) TIMES DAILY - WITH MEALS AND AT BEDTIME. (Patient not taking: Reported on 04/04/2018) 60 tablet 0  . triamcinolone (KENALOG) 0.025 % ointment Apply 1 application topically 2 (two) times daily. (Patient not taking: Reported on 04/04/2018) 30 g 0   No current facility-administered medications for this visit.     Family History  Problem Relation Age of Onset  . Alcohol abuse  Mother   . Arthritis Mother   . Depression Mother   . Drug abuse Mother   . Miscarriages / [place] Mother   . Bipolar disorder Mother   . Alcohol abuse Father   . COPD Father   . Depression Sister   . Colon cancer Maternal Aunt   . Ovarian cancer Maternal Aunt   . Hypertension Maternal Grandmother   . Alcohol abuse Sister   . Alcohol abuse Sister     Review of Systems  Constitutional: Negative.   HENT: Negative.   Eyes: Negative.   Respiratory: Negative.   Cardiovascular: Negative.   Gastrointestinal: Negative.   Endocrine: Negative.   Genitourinary: Positive for menstrual problem.       Painful menses  Musculoskeletal: Negative.   Skin: Negative.   Allergic/Immunologic: Negative.   Neurological: Negative.   Hematological: Negative.   Psychiatric/Behavioral: Negative.     Exam:   BP 110/80 (BP Location: Right Arm, Patient Position: Sitting, Cuff Size: Normal)   Pulse 68   Ht 5' 5.5" (1.664 m)   Wt 131 lb 12.8 oz (59.8 kg)   LMP 03/23/2018 (Exact Date)   BMI 21.60 kg/m   Weight change: @WEIGHTCHANGE @ Height:   Height: 5' 5.5" (166.4 cm)  Ht Readings from Last 3 Encounters:  04/04/18 5' 5.5" (1.664 m)  03/11/18 5\' 5"  (1.651 m)  02/13/18 5\' 5"  (1.651 m)    General appearance: alert, cooperative and appears stated age Head: Normocephalic, without obvious abnormality, atraumatic Neck: no adenopathy, supple, symmetrical, trachea midline and thyroid normal to inspection and palpation Abdomen: soft, non-tender; non distended,  no masses,  no organomegaly Extremities: extremities normal, atraumatic, no cyanosis or edema Skin: Skin color, texture, turgor normal. No rashes or lesions No abnormal inguinal nodes palpated Neurologic: Grossly normal   Pelvic: External genitalia:  no lesions              Urethra:  normal appearing urethra with no masses, tenderness or lesions              Bartholins and Skenes: normal                 Vagina: normal appearing vagina  with normal color and discharge, no lesions              Cervix: no lesions               Bimanual Exam:  Uterus:  normal size, contour, position, consistency, mobility, non-tender              Adnexa: no mass, fullness, tenderness               Rectovaginal: Confirms               Anus:  normal sphincter tone, no lesions  Chaperone was present for exam.  A:  AUB after starting OCP's, suspect the cycle  she had just prior to OCP's was anovulatory. Polypoid endometrium on tissue she passed  PMDD has resolved on OCP's  P:   I will have her return for an ultrasound after her cycle (finishing her 3rd pack of pills and plans to take the placebo pills)  I suspect the very heavy bleeding and passage of tissue was from an overgrowth of her endometrium from an anovulatory cycle.   Recent normal TSH, CBC  As long as her ultrasound is normal, I would have her continue on OCP's. Typically the break through bleeding will improve, if not she may need to take the pill cyclically or try a different pill   CC: Dr Juleen China Note sent

## 2018-04-05 ENCOUNTER — Telehealth: Payer: Self-pay | Admitting: Obstetrics and Gynecology

## 2018-04-05 NOTE — Telephone Encounter (Signed)
Call placed to convey benefits for recommended ultrasound.

## 2018-04-08 NOTE — Telephone Encounter (Signed)
Second call placed to patient to review benefits for recommended ultrasound. Left voicemail message requesting a return call

## 2018-04-09 NOTE — Telephone Encounter (Signed)
Third call placed, to review benefits for recommended ultrasound, (to be scheduled after her next period, per provider scheduling instructions). Left voicemail message requesting a return call

## 2018-04-10 NOTE — Telephone Encounter (Signed)
Fourth call placed to patient to review benefits for recommended ultrasound (to be scheduled after her next period, per provider scheduling instructions). Left voicemail message requesting a return call

## 2018-04-25 NOTE — Telephone Encounter (Signed)
Call placed to patient to review benefits and to scheduled recommended ultrasound. Left voicemail message requesting a return call

## 2018-04-29 NOTE — Telephone Encounter (Signed)
Call to patient. Per ROI, can leave message on voice mail which has name confirmation. Left message calling to follow-up on recommendations from Dr Talbert Nan and have been unable to reach her. Requested call back to [facility], Buyer, retail, with update.

## 2018-04-29 NOTE — Telephone Encounter (Signed)
Multiple calls have been placed to patient to schedule recommended ultrasound. To date patient has not responded. Forwarding to provider to review and advise.  Routing to Dr Talbert Nan  cc: Lamont Snowball, RN

## 2018-05-05 ENCOUNTER — Encounter: Payer: Self-pay | Admitting: Family Medicine

## 2018-05-09 ENCOUNTER — Other Ambulatory Visit: Payer: Self-pay

## 2018-05-13 ENCOUNTER — Telehealth: Payer: Self-pay | Admitting: Family Medicine

## 2018-05-13 NOTE — Telephone Encounter (Signed)
Left message to return call to our office.  

## 2018-05-13 NOTE — Telephone Encounter (Signed)
See note  Copied from [place] [phone]. Topic: General - Inquiry >> May 10, 2018  4:08 PM Vernona Rieger wrote: Reason for CRM: Patient said she was just in the office and Tillie Rung was showing her how to use her Glucometer. She said that she had to leave in a hurry and has some other questions on how to use it. She would like to speak with Tillie Rung, please reach her at [phone]

## 2018-05-13 NOTE — Telephone Encounter (Signed)
Left message to return phone call.

## 2018-05-13 NOTE — Telephone Encounter (Signed)
See note

## 2018-05-13 NOTE — Telephone Encounter (Signed)
Patient returned call

## 2018-05-14 NOTE — Telephone Encounter (Signed)
Called patient back. She wanted to know when to check let her know that when she feels symptomatic is when she need to check. She will call back if any questions.

## 2018-05-14 NOTE — Telephone Encounter (Signed)
See phone notes.

## 2018-05-14 NOTE — Telephone Encounter (Signed)
Pt returned call

## 2018-05-14 NOTE — Telephone Encounter (Signed)
Lm to call office

## 2018-05-15 ENCOUNTER — Ambulatory Visit: Payer: Self-pay | Admitting: *Deleted

## 2018-05-15 NOTE — Telephone Encounter (Signed)
Contacted Victoria York regarding her symptoms;  She says that she woke up 05/15/2018 at 0200 with a pain in her right neck/shoulder area; the pain is described as shooting; the patient thinks she was bitten by a spider last night; the Victoria York says that she had a coworker look at the area and was told that there appears to be a bite in the area that she was having pain; She says that the pain is radiating into her arm; she is experiencing aching shoulders and pain, stomach aches and diarrhea (1 episode 05/20/2018); the Victoria York says that she has stomach problems; recommendations made per nurse triage; the Victoria York verbalizes understanding; the Victoria York normally sees Dr Briscoe Deutscher; will route to office for notification of this encounter.   Reason for Disposition . Sounds like a life-threatening emergency to the triager . Shoulder pain . Non-serious spider bite  Answer Assessment - Initial Assessment Questions 1. ONSET: "When did the pain start?"     05/20/2018 at 0300 2. LOCATION: "Where is the pain located?"     Right shoulder/neck 3. PAIN: "How bad is the pain?" (Scale 1-10; or mild, moderate, severe)   - MILD (1-3): doesn't interfere with normal activities   - MODERATE (4-7): interferes with normal activities (e.g., work or school) or awakens from sleep   - SEVERE (8-10): excruciating pain, unable to do any normal activities, unable to move arm at all due to pain     mild 4. WORK OR EXERCISE: "Has there been any recent work or exercise that involved this part of the body?"     no 5. CAUSE: "What do you think is causing the shoulder pain?"     ? Spider bite 6. OTHER SYMPTOMS: "Do you have any other symptoms?" (e.g., neck pain, swelling, rash, fever, numbness, weakness)     Neck pain, redness at site of ? Spider bite, pain in right arm (described as an uncomfortable feeling 7. PREGNANCY: "Is there any chance you are pregnant?" "When was your last menstrual period?"     No birth control pills; LMP Dec 2019  Answer Assessment  - Initial Assessment Questions 1. TYPE of INSECT: "What type of insect was it?"      ? spider 2. ONSET: "When did you get bitten?"      05/15/2018 at 0300  3. LOCATION: "Where is the insect bite located?"      Right shoulder close to neck 4. REDNESS: "Is the area red or pink?" If so, ask "What size is area of redness?" (inches or cm). "When did the redness start?"     Yes; Victoria York can not visualize the size of the area 5. PAIN: "Is there any pain?" If so, ask: "How bad is it?"  (Scale 1-10; or mild, moderate, severe)     mild 6. ITCHING: "Does it itch?" If so, ask: "How bad is the itch?"    - MILD: doesn't interfere with normal activities   - MODERATE - SEVERE: interferes with work, school, sleep, or other activities      no 7. SWELLING: "How big is the swelling?" (inches, cm, or compare to coins)     no 8. OTHER SYMPTOMS: "Do you have any other symptoms?"  (e.g., difficulty breathing, hives)     Diarrhea x 1 episode; stomach pain and nausea now resolved; pain radiating to right arm; shoulder soreness;; right arm slightly numb 9. PREGNANCY: "Is there any chance you are pregnant?" "When was your last menstrual period?"     no 3  month birth control cycle; LMP 03/2018  Answer Assessment - Initial Assessment Questions 1. TYPE of SPIDER: "What type of spider was it?"  (e.g., name, unknown, or brief description)     *No Answer* 2. LOCATION: "Where is the bite located?"      *No Answer* 3. PAIN: "Is there any pain?" If so, ask: "How bad is it?"  (Scale 1-10; or mild, moderate, severe)     *No Answer* 4. SWELLING: "How big is the swelling?" (Inches, cm or compare to coins)      *No Answer* 5. ONSET: "When did the bite occur?" (Minutes or hours ago)      *No Answer* 6. TETANUS: "When was the last tetanus booster?"      Yes not sure of last booster 7. OTHER SYMPTOMS: "Do you have any other symptoms?"  (e.g., muscle cramps, abdominal pain, change in urine color)     *No Answer*  Protocols used:  SHOULDER PAIN-A-AH, SPIDER BITE - [place]-A-AH, INSECT BITE-A-AH

## 2018-05-15 NOTE — Telephone Encounter (Signed)
See note

## 2018-05-15 NOTE — Telephone Encounter (Signed)
Called patient she states that pain has decreased. She will look for swelling, pain, nausea or vomiting or fever. She will call office or go to ED if starts having symptoms.

## 2018-05-16 ENCOUNTER — Telehealth: Payer: Self-pay | Admitting: *Deleted

## 2018-05-16 NOTE — Telephone Encounter (Signed)
See next phone encounter for follow up.

## 2018-05-16 NOTE — Telephone Encounter (Signed)
Patient seen 04-04-18 for AUB. See office note. Pelvic ultrasound ordered and patient did not return previous attempts to schedule.  Follow -up call to patient:  Per DPR, can leave message on voice mail which has name.  Left message calling to follow-up on bleeding and plans to schedule recommended ultrasound.  Requested call back to [facility] or any triage nurse.

## 2018-06-05 ENCOUNTER — Other Ambulatory Visit: Payer: Self-pay | Admitting: Family Medicine

## 2018-06-05 DIAGNOSIS — R110 Nausea: Secondary | ICD-10-CM

## 2018-06-05 NOTE — Telephone Encounter (Signed)
Spoke with patient. Calling to schedule PUS.   1. LMP 05/29/18, last approximately 7 days. Reports bleeding as moderate, changes pad q few hours, not saturated. Denies fatigue, weakness, dizziness, lightheadedness. Taking OCP daily, no missed or late pills.   2. PUS scheduled for 06/18/18 at 12:30pm, consult at 1pm with Dr. Talbert Nan.   Routing to provider for final review. Patient is agreeable to disposition. Will close encounter.  Cc: Lerry Liner, Lamont Snowball, RN

## 2018-06-05 NOTE — Telephone Encounter (Signed)
Patient is ready to schedule her ultrasound appointment.  °

## 2018-06-11 ENCOUNTER — Telehealth: Payer: Self-pay | Admitting: Neurology

## 2018-06-11 ENCOUNTER — Telehealth: Payer: Self-pay | Admitting: Obstetrics and Gynecology

## 2018-06-11 NOTE — Telephone Encounter (Signed)
Call placed to patient regarding scheduled ultrasound appointment on 06/18/2018. Insurance coverage on file has terminated as of 12.31.2019. will need new insurance information for pre-certification of schedule appointment. Left voicemail message requesting a return call

## 2018-06-11 NOTE — Telephone Encounter (Signed)
Patient returned call to [facility]. New insurance entered for pre-certification os scheduled PUS.

## 2018-06-11 NOTE — Telephone Encounter (Signed)
We have attempted to call the patient 2 times to schedule sleep study. Patient has been unavailable at the phone numbers we have on file and has not returned our calls. At this point we will send a letter asking pt to please contact the sleep lab to schedule their sleep study. If patient calls back we will schedule them for their sleep study. ° °

## 2018-06-17 NOTE — Telephone Encounter (Signed)
Benefits have been pre-certified with new insurance plan for appointment scheduled for 06/18/2018 and conveyed to patient by Thayer Ohm on 06/14/2018. Will close encounter

## 2018-06-18 ENCOUNTER — Encounter: Payer: Self-pay | Admitting: Obstetrics and Gynecology

## 2018-06-18 ENCOUNTER — Other Ambulatory Visit: Payer: 59

## 2018-06-18 ENCOUNTER — Other Ambulatory Visit: Payer: Self-pay | Admitting: Obstetrics and Gynecology

## 2018-06-18 ENCOUNTER — Ambulatory Visit: Payer: 59 | Admitting: Obstetrics and Gynecology

## 2018-06-18 ENCOUNTER — Other Ambulatory Visit: Payer: Self-pay

## 2018-06-18 VITALS — BP 110/68 | HR 76 | Wt 136.6 lb

## 2018-06-18 DIAGNOSIS — N939 Abnormal uterine and vaginal bleeding, unspecified: Secondary | ICD-10-CM

## 2018-06-18 DIAGNOSIS — N921 Excessive and frequent menstruation with irregular cycle: Secondary | ICD-10-CM

## 2018-06-18 DIAGNOSIS — N912 Amenorrhea, unspecified: Secondary | ICD-10-CM

## 2018-06-18 NOTE — Progress Notes (Signed)
GYNECOLOGY  VISIT   HPI: 33 y.o.   Married White or Caucasian Not Hispanic or Latino  female   [phone] with Patient's last menstrual period was 06/15/2018 (exact date).   here for  Consult following PUS. The patient has had AUB since starting OCP's, suspect the cycle prior to starting OCP's was anovulatory. She was taking the pills continuously, took a break at 3 months (placebo at the end of the pack). Bleed x 3 day, moderate flow. She then restarted the pills at the end of December, she started bleeding 1/27 bleed until 2/15. Spotting since. The bleeding from the end of January through February initially seemed like a normal period. By the second week she was passing clots/tissue. At the heaviest she would saturate a super tampon in 2-3 hours.  She was started on OCP's for PMDD, better on the pill.   GYNECOLOGIC HISTORY: Patient's last menstrual period was 06/15/2018 (exact date). Contraception:OCP Menopausal hormone therapy: None        OB History    Gravida  5   Para  2   Term  2   Preterm      AB  3   Living  2     SAB  2   TAB  1   Ectopic      Multiple      Live Births  2              Patient Active Problem List   Diagnosis Date Noted  . 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 Daytime somnolence 01/09/2018  . Dysplastic nevus of skin 12/11/2017  . Anxiety 12/11/2017  . Vitamin D deficiency 12/11/2017  . 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 Irritable bowel syndrome 06/07/2016  . Posterior rhinorrhea 06/07/2016    Past Medical History:  Diagnosis Date  . Abnormal Pap smear   . Abnormal uterine bleeding   . Anemia with pregnancies   . Anxiety 12/11/2017  . Depression   . Dysmenorrhea   . Dysplastic nevus of skin 12/11/2017  . Hormone disorder   . Hx of varicella   . Irritable bowel syndrome 06/07/2016  . Vitamin D deficiency 12/11/2017    Past Surgical History:  Procedure Laterality Date  . COLPOSCOPY    . DILATION AND CURETTAGE OF UTERUS    .  LEEP    . MINOR HEMORRHOIDECTOMY      Current Outpatient Medications  Medication Sig Dispense Refill  . FLUoxetine (PROZAC) 20 MG capsule Take 1 capsule (20 mg total) by mouth daily. 30 capsule 3  . norgestimate-ethinyl estradiol (SPRINTEC 28) 0.25-35 MG-MCG tablet Take continuously for three months 4 Package 3  . omeprazole (PRILOSEC) 40 MG capsule TAKE 1 CAPSULE BY MOUTH EVERY DAY 30 capsule 3  . dicyclomine (BENTYL) 10 MG capsule Take 1 capsule (10 mg total) by mouth 4 (four) times daily -  before meals and at bedtime. (Patient not taking: Reported on 04/04/2018) 60 capsule 1  . sucralfate (CARAFATE) 1 g tablet TAKE 1 TABLET (1 G TOTAL) BY MOUTH 4 (FOUR) TIMES DAILY - WITH MEALS AND AT BEDTIME. (Patient not taking: Reported on 06/18/2018) 60 tablet 0  . triamcinolone (KENALOG) 0.025 % ointment Apply 1 application topically 2 (two) times daily. (Patient not taking: Reported on 04/04/2018) 30 g 0   No current facility-administered medications for this visit.      ALLERGIES: Penicillins  Family History  Problem Relation Age of Onset  . Alcohol abuse Mother   . Arthritis Mother   .  Depression Mother   . Drug abuse Mother   . Miscarriages / [place] Mother   . Bipolar disorder Mother   . Alcohol abuse Father   . COPD Father   . Depression Sister   . Colon cancer Maternal Aunt   . Ovarian cancer Maternal Aunt   . Hypertension Maternal Grandmother   . Alcohol abuse Sister   . Alcohol abuse Sister     Social History   Socioeconomic History  . Marital status: Married    Spouse name: Not on file  . Number of children: 2  . Years of education: Not on file  . Highest education level: Not on file  Occupational History  . Not on file  Social Needs  . Financial resource strain: Not on file  . Food insecurity:    Worry: Not on file    Inability: Not on file  . Transportation needs:    Medical: Not on file    Non-medical: Not on file  Tobacco Use  . Smoking status: Former  Smoker    Last attempt to quit: 12/12/2007    Years since quitting: 10.5  . Smokeless tobacco: Never Used  Substance and Sexual Activity  . Alcohol use: No  . Drug use: No  . Sexual activity: Yes    Birth control/protection: Pill  Lifestyle  . Physical activity:    Days per week: Not on file    Minutes per session: Not on file  . Stress: Not on file  Relationships  . Social connections:    Talks on phone: Not on file    Gets together: Not on file    Attends religious service: Not on file    Active member of club or organization: Not on file    Attends meetings of clubs or organizations: Not on file    Relationship status: Not on file  . Intimate partner violence:    Fear of current or ex partner: Not on file    Emotionally abused: Not on file    Physically abused: Not on file    Forced sexual activity: Not on file  Other Topics Concern  . Not on file  Social History Narrative  . Not on file    Review of Systems  Constitutional: Negative.   HENT: Positive for congestion and sinus pain.   Eyes: Negative.   Respiratory: Negative.   Cardiovascular: Negative.   Gastrointestinal: Positive for diarrhea.  Genitourinary: Negative.        Menses changes Vaginal discharge after menses Vaginal itching after menses  Musculoskeletal: Negative.   Skin: Negative.   Neurological: Negative.   Endo/Heme/Allergies:       Heat intolerance  Psychiatric/Behavioral: Negative.     PHYSICAL EXAMINATION:    BP 110/68 (BP Location: Right Arm, Patient Position: Sitting, Cuff Size: Normal)   Pulse 76   Wt 136 lb 9.6 oz (62 kg)   LMP 06/15/2018 (Exact Date)   BMI 22.39 kg/m     General appearance: alert, cooperative and appears stated age  Ultrasound images were reviewed with the patient  ASSESSMENT Breakthrough bleeding on continuous OCP's, normal ultrasound    PLAN She will start taking the pills cyclically Can take 4 days off instead of 7   An After Visit Summary was  printed and given to the patient.

## 2018-06-24 ENCOUNTER — Ambulatory Visit: Payer: 59 | Admitting: Family Medicine

## 2018-06-27 NOTE — Progress Notes (Deleted)
Victoria York is a 33 y.o. female is here for follow up.  History of Present Illness:   HPI: See Assessment and Plan section for Problem Based Charting of issues discussed today.   There are no preventive care reminders to display for this patient. Depression screen [facility] 2/9 03/20/2018 12/11/2017  Decreased Interest 0 2  Down, Depressed, Hopeless 0 0  PHQ - 2 Score 0 2  Altered sleeping 1 1  Tired, decreased energy 0 1  Change in appetite 0 3  Feeling bad or failure about yourself  0 1  Trouble concentrating 0 3  Moving slowly or fidgety/restless 0 0  Suicidal thoughts 0 0  PHQ-9 Score 1 11  Difficult doing work/chores - Somewhat difficult   PMHx, SurgHx, SocialHx, FamHx, Medications, and Allergies were reviewed in the Visit Navigator and updated as appropriate.   Patient Active Problem List   Diagnosis Date Noted  . 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 Daytime somnolence 01/09/2018  . Dysplastic nevus of skin 12/11/2017  . Anxiety 12/11/2017  . Vitamin D deficiency 12/11/2017  . 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 Irritable bowel syndrome 06/07/2016  . Posterior rhinorrhea 06/07/2016   Social History   Tobacco Use  . Smoking status: Former Smoker    Last attempt to quit: 12/12/2007    Years since quitting: 10.5  . Smokeless tobacco: Never Used  Substance Use Topics  . Alcohol use: No  . Drug use: No   Current Medications and Allergies:   Current Outpatient Medications:  .  dicyclomine (BENTYL) 10 MG capsule, Take 1 capsule (10 mg total) by mouth 4 (four) times daily -  before meals and at bedtime. (Patient not taking: Reported on 04/04/2018), Disp: 60 capsule, Rfl: 1 .  FLUoxetine (PROZAC) 20 MG capsule, Take 1 capsule (20 mg total) by mouth daily., Disp: 30 capsule, Rfl: 3 .  norgestimate-ethinyl estradiol (SPRINTEC 28) 0.25-35 MG-MCG tablet, Take continuously for three months, Disp: 4 Package, Rfl: 3 .  omeprazole (PRILOSEC) 40 MG capsule, TAKE 1 CAPSULE BY  MOUTH EVERY DAY, Disp: 30 capsule, Rfl: 3 .  sucralfate (CARAFATE) 1 g tablet, TAKE 1 TABLET (1 G TOTAL) BY MOUTH 4 (FOUR) TIMES DAILY - WITH MEALS AND AT BEDTIME. (Patient not taking: Reported on 06/18/2018), Disp: 60 tablet, Rfl: 0 .  triamcinolone (KENALOG) 0.025 % ointment, Apply 1 application topically 2 (two) times daily. (Patient not taking: Reported on 04/04/2018), Disp: 30 g, Rfl: 0  Allergies  Allergen Reactions  . Penicillins Hives   Review of Systems   Pertinent items are noted in the HPI. Otherwise, ROS is negative.  Vitals:  There were no vitals filed for this visit.   There is no height or weight on file to calculate BMI.  Physical Exam:   Physical Exam  Results for orders placed or performed in visit on 04/04/18  POCT urine pregnancy  Result Value Ref Range   Preg Test, Ur Negative Negative    Assessment and Plan:   No problem-specific Assessment & Plan notes found for this encounter.  No orders of the defined types were placed in this encounter.  No orders of the defined types were placed in this encounter.   . Reviewed expectations re: course of current medical issues. . Discussed self-management of symptoms. . Outlined signs and symptoms indicating need for more acute intervention. . Patient verbalized understanding and all questions were answered. Marland Kitchen Health Maintenance issues including appropriate healthy diet, exercise, and smoking avoidance were discussed with  patient. . See orders for this visit as documented in the electronic medical record. . Patient received an After Visit Summary.  Briscoe Deutscher, DO [place], [facility] 06/27/2018

## 2018-06-28 ENCOUNTER — Ambulatory Visit: Payer: 59 | Admitting: Family Medicine

## 2018-07-04 ENCOUNTER — Other Ambulatory Visit: Payer: Self-pay | Admitting: Family Medicine

## 2018-07-05 ENCOUNTER — Encounter: Payer: Self-pay | Admitting: Family Medicine

## 2018-07-05 ENCOUNTER — Ambulatory Visit (INDEPENDENT_AMBULATORY_CARE_PROVIDER_SITE_OTHER): Payer: 59 | Admitting: Family Medicine

## 2018-07-05 VITALS — BP 98/68 | HR 68 | Temp 99.2°F | Ht 65.5 in | Wt 136.8 lb

## 2018-07-05 DIAGNOSIS — R5381 Other malaise: Secondary | ICD-10-CM | POA: Diagnosis not present

## 2018-07-05 DIAGNOSIS — K30 Functional dyspepsia: Secondary | ICD-10-CM

## 2018-07-05 DIAGNOSIS — F419 Anxiety disorder, unspecified: Secondary | ICD-10-CM

## 2018-07-05 DIAGNOSIS — R400 Somnolence: Secondary | ICD-10-CM

## 2018-07-05 DIAGNOSIS — R5383 Other fatigue: Secondary | ICD-10-CM

## 2018-07-05 MED ORDER — FLUOXETINE HCL 40 MG PO CAPS: 40.0000 mg | ORAL_CAPSULE | Freq: Every day | ORAL | 3 refills | Status: DC

## 2018-07-05 NOTE — Progress Notes (Signed)
Victoria EHRICH is a 33 y.o. female is here for follow up.  History of Present Illness:   HPI: Mood improved with Prozac, but not resolved. Family life good. Menses spotting improved with monthly OCPs. Still very anxious. Still with premendtrual mood changes, which is what she struggles with most. Dyspepsia has improved with PPI.  She complains of fatigue, daily. Could sleep through the day if allowed. Wonders if anemia is an issue, with recent menstrual issues. Exercising. Eating well. Sleeps through the night. No snoring. No ETOH, tobacco, drug use. Negative pregnancy test recently.   There are no preventive care reminders to display for this patient. Depression screen [facility] 2/9 07/05/2018 03/20/2018 12/11/2017  Decreased Interest 2 0 2  Down, Depressed, Hopeless 0 0 0  PHQ - 2 Score 2 0 2  Altered sleeping 2 1 1   Tired, decreased energy 2 0 1  Change in appetite 2 0 3  Feeling bad or failure about yourself  0 0 1  Trouble concentrating 2 0 3  Moving slowly or fidgety/restless 0 0 0  Suicidal thoughts 0 0 0  PHQ-9 Score 10 1 11   Difficult doing work/chores Not difficult at all - Somewhat difficult   PMHx, SurgHx, SocialHx, FamHx, Medications, and Allergies were reviewed in the Visit Navigator and updated as appropriate.   Patient Active Problem List   Diagnosis Date Noted  . 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 Daytime somnolence 01/09/2018  . Dysplastic nevus of skin 12/11/2017  . Anxiety 12/11/2017  . Vitamin D deficiency 12/11/2017  . 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 Irritable bowel syndrome 06/07/2016  . Posterior rhinorrhea 06/07/2016   Social History   Tobacco Use  . Smoking status: Former Smoker    Last attempt to quit: 12/12/2007    Years since quitting: 10.5  . Smokeless tobacco: Never Used  Substance Use Topics  . Alcohol use: No  . Drug use: No   Current Medications and Allergies   .  dicyclomine (BENTYL) 10 MG capsule, Take 1 capsule (10 mg total) by  mouth 4 (four) times daily -  before meals and at bedtime. (Patient not taking: Reported on 04/04/2018), Disp: 60 capsule, Rfl: 1 .  FLUoxetine (PROZAC) 20 MG capsule, TAKE 1 CAPSULE BY MOUTH EVERY DAY, Disp: 90 capsule, Rfl: 2 .  norgestimate-ethinyl estradiol (SPRINTEC 28) 0.25-35 MG-MCG tablet, Take continuously for three months, Disp: 4 Package, Rfl: 3 .  omeprazole (PRILOSEC) 40 MG capsule, TAKE 1 CAPSULE BY MOUTH EVERY DAY, Disp: 30 capsule, Rfl: 3 .  sucralfate (CARAFATE) 1 g tablet, TAKE 1 TABLET (1 G TOTAL) BY MOUTH 4 (FOUR) TIMES DAILY - WITH MEALS AND AT BEDTIME. (Patient not taking: Reported on 06/18/2018), Disp: 60 tablet, Rfl: 0 .  triamcinolone (KENALOG) 0.025 % ointment, Apply 1 application topically 2 (two) times daily. (Patient not taking: Reported on 04/04/2018), Disp: 30 g, Rfl: 0   Allergies  Allergen Reactions  . Penicillins Hives   Review of Systems   Pertinent items are noted in the HPI. Otherwise, a complete ROS is negative.  Vitals   Vitals:   07/05/18 1427  BP: 98/68  Pulse: 68  Temp: 99.2 F (37.3 C)  TempSrc: Oral  SpO2: 97%  Weight: 136 lb 12 oz (62 kg)  Height: 5' 5.5" (1.664 m)     Body mass index is 22.41 kg/m.  Physical Exam   Physical Exam Vitals signs and nursing note reviewed.  HENT:     Head: Normocephalic and atraumatic.  Eyes:     Pupils: Pupils are equal, round, and reactive to light.  Neck:     Musculoskeletal: Normal range of motion and neck supple.  Cardiovascular:     Rate and Rhythm: Normal rate and regular rhythm.     Heart sounds: Normal heart sounds.  Pulmonary:     Effort: Pulmonary effort is normal.  Abdominal:     Palpations: Abdomen is soft.  Skin:    General: Skin is warm.  Psychiatric:        Behavior: Behavior normal.    Assessment and Plan   Victoria York was seen today for follow-up and menstrual problem.  Diagnoses and all orders for this visit:  Malaise and fatigue Comments: Labs today. Orders: -     CBC  with Differential/Platelet -     Iron, TIBC and Ferritin Panel  Anxiety Comments: Improving. Okay to increase Prozac.  Orders: -     FLUoxetine (PROZAC) 40 MG capsule; Take 1 capsule (40 mg total) by mouth daily.  Daytime somnolence Comments: This may be her final diagnosis for fatigue. Will encourage continued healthy lifestyle.   Nonulcer dyspepsia Comments: Controlled on current regimen. Continue PPI.   . Orders and follow up as documented in [place], reviewed diet, exercise and weight control, cardiovascular risk and specific lipid/LDL goals reviewed, reviewed medications and side effects in detail.  . Reviewed expectations re: course of current medical issues. . Outlined signs and symptoms indicating need for more acute intervention. . Patient verbalized understanding and all questions were answered. . Patient received an After Visit Summary.  Briscoe Deutscher, DO [place], [facility] 07/07/2018

## 2018-07-06 LAB — CBC WITH DIFFERENTIAL/PLATELET
Absolute Monocytes: 330 cells/uL (ref 200–950)
Basophils Absolute: 53 cells/uL (ref 0–200)
Basophils Relative: 0.7 %
Eosinophils Absolute: 68 cells/uL (ref 15–500)
Eosinophils Relative: 0.9 %
HCT: 37.5 % (ref 35.0–45.0)
Hemoglobin: 12.2 g/dL (ref 11.7–15.5)
Lymphs Abs: 2025 cells/uL (ref 850–3900)
MCH: 27.4 pg (ref 27.0–33.0)
MCHC: 32.5 g/dL (ref 32.0–36.0)
MCV: 84.1 fL (ref 80.0–100.0)
MPV: 10.5 fL (ref 7.5–12.5)
Monocytes Relative: 4.4 %
Neutro Abs: 5025 cells/uL (ref 1500–7800)
Neutrophils Relative %: 67 %
Platelets: 290 10*3/uL (ref 140–400)
RBC: 4.46 10*6/uL (ref 3.80–5.10)
RDW: 11.9 % (ref 11.0–15.0)
Total Lymphocyte: 27 %
WBC: 7.5 10*3/uL (ref 3.8–10.8)

## 2018-07-06 LAB — IRON,TIBC AND FERRITIN PANEL
%SAT: 26 % (calc) (ref 16–45)
Ferritin: 20 ng/mL (ref 16–154)
Iron: 101 ug/dL (ref 40–190)
TIBC: 391 mcg/dL (calc) (ref 250–450)

## 2018-07-07 ENCOUNTER — Encounter: Payer: Self-pay | Admitting: Family Medicine

## 2018-08-05 ENCOUNTER — Encounter: Payer: Self-pay | Admitting: Family Medicine

## 2018-08-11 NOTE — Progress Notes (Addendum)
Virtual Visit via Video   I connected with Victoria York by a video enabled telemedicine application and verified that I am speaking with the correct person using two identifiers. Location patient: Home Location provider: [facility], Office Persons participating in the virtual visit: [name], [name], DO Victoria York, CMA acting as scribe for Dr. Briscoe Deutscher.   I discussed the limitations of evaluation and management by telemedicine and the availability of in person appointments. The patient expressed understanding and agreed to proceed.  Subjective:   HPI:  Malaise and fatigue Labs checked at last visit was instructed to start iron supplement every other day. She has been taking as directed. She is not having as much fatigue now just more trouble sleeping at night due to anxiety. She does feel like she has had some improvement with the iron.   Anxiety Patient Prozac was increased to 40mg  at last visit. Current symptoms: difficulty concentrating, feelings of losing control, insomnia. No current suicidal and homicidal ideation. Side effects from treatment: none. She has noticed that she has had increased panic attacks. It is due to the covid situation. She is having a lot of trouble sleeping at night. She is waking up off and on all night. She has had some night terrors. She has tried self southing and other self calming exercises but they are not helping. She has increased symptoms at night. She has not ever tried any sleep aid. She did notice that the symptoms started around the time that she increased the zoloft. As far as changing that she does not want to make change in that at this she feels good emotionally at this time.    Nonulcer dyspepsia At last visit was doing well on Controlled on current regimen. She is currently on Prilosec 40mg .  ROS: See pertinent positives and negatives per HPI.  Patient Active Problem List   Diagnosis Date Noted  . Intrinsic atopic  dermatitis 03/21/2018  . Daytime somnolence 01/09/2018  . Dysplastic nevus of skin 12/11/2017  . Anxiety 12/11/2017  . Vitamin D deficiency 12/11/2017  . 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 Irritable bowel syndrome 06/07/2016  . Posterior rhinorrhea 06/07/2016    Social History   Tobacco Use  . Smoking status: Former Smoker    Last attempt to quit: 12/12/2007    Years since quitting: 10.6  . Smokeless tobacco: Never Used  Substance Use Topics  . Alcohol use: No   Current Outpatient Medications:  .  FLUoxetine (PROZAC) 40 MG capsule, Take 1 capsule (40 mg total) by mouth daily., Disp: 90 capsule, Rfl: 3 .  norgestimate-ethinyl estradiol (SPRINTEC 28) 0.25-35 MG-MCG tablet, Take continuously for three months, Disp: 4 Package, Rfl: 3 .  omeprazole (PRILOSEC) 40 MG capsule, TAKE 1 CAPSULE BY MOUTH EVERY DAY, Disp: 30 capsule, Rfl: 3 .  triamcinolone (KENALOG) 0.025 % ointment, Apply 1 application topically 2 (two) times daily., Disp: 30 g, Rfl: 0  Allergies  Allergen Reactions  . Penicillins Hives    Objective:   VITALS: Per patient if applicable, see vitals. GENERAL: Alert, appears well and in no acute distress. HEENT: Atraumatic, conjunctiva clear, no obvious abnormalities on inspection of external nose and ears. NECK: Normal movements of the head and neck. CARDIOPULMONARY: No increased WOB. Speaking in clear sentences. I:E ratio WNL.  MS: Moves all visible extremities without noticeable abnormality. PSYCH: Pleasant and cooperative, well-groomed. Speech normal rate and rhythm. Affect is appropriate. Insight and judgement are appropriate. Attention is focused, linear, and  appropriate.  NEURO: CN grossly intact. Oriented as arrived to appointment on time with no prompting. Moves both UE equally.  SKIN: No obvious lesions, wounds, erythema, or cyanosis noted on face or hands.  Assessment and Plan:   Victoria York was seen today for follow-up.  Diagnoses and all orders for this  visit:  Anxiety Comments: Improved with increase in Prozac to 40 mg dose. With insomnia and "night terrors." Orders: -     FLUoxetine (PROZAC) 40 MG capsule; Take 1 capsule (40 mg total) by mouth daily.  Intrinsic atopic dermatitis Comments: Needs refill today. Orders: -     triamcinolone (KENALOG) 0.025 % ointment; Apply 1 application topically 2 (two) times daily.  Nonulcer dyspepsia Comments: Controlled. Continue current treatment.   Adjustment insomnia -     ALPRAZolam (XANAX) 0.5 MG tablet; Take 1 tablet (0.5 mg total) by mouth at bedtime.   . Reviewed expectations re: course of current medical issues. . Discussed self-management of symptoms. . Outlined signs and symptoms indicating need for more acute intervention. . Patient verbalized understanding and all questions were answered. Marland Kitchen Health Maintenance issues including appropriate healthy diet, exercise, and smoking avoidance were discussed with patient. . See orders for this visit as documented in the electronic medical record.  Briscoe Deutscher, DO  Records requested if needed. Time spent: 30 minutes, of which >50% was spent in obtaining information about her symptoms, reviewing her previous labs, evaluations, and treatments, counseling her about her condition (please see the discussed topics above), and developing a plan to further investigate it; she had a number of questions which I addressed.

## 2018-08-12 ENCOUNTER — Ambulatory Visit (INDEPENDENT_AMBULATORY_CARE_PROVIDER_SITE_OTHER): Payer: 59 | Admitting: Family Medicine

## 2018-08-12 ENCOUNTER — Encounter: Payer: Self-pay | Admitting: Family Medicine

## 2018-08-12 ENCOUNTER — Other Ambulatory Visit: Payer: Self-pay

## 2018-08-12 VITALS — Ht 65.5 in | Wt 136.0 lb

## 2018-08-12 DIAGNOSIS — F5102 Adjustment insomnia: Secondary | ICD-10-CM

## 2018-08-12 DIAGNOSIS — K30 Functional dyspepsia: Secondary | ICD-10-CM

## 2018-08-12 DIAGNOSIS — L2084 Intrinsic (allergic) eczema: Secondary | ICD-10-CM

## 2018-08-12 DIAGNOSIS — F419 Anxiety disorder, unspecified: Secondary | ICD-10-CM | POA: Diagnosis not present

## 2018-08-12 MED ORDER — FLUOXETINE HCL 40 MG PO CAPS: 40.0000 mg | ORAL_CAPSULE | Freq: Every day | ORAL | 3 refills | Status: DC

## 2018-08-12 MED ORDER — TRIAMCINOLONE ACETONIDE 0.025 % EX OINT: 1.0000 "application " | TOPICAL_OINTMENT | Freq: Two times a day (BID) | CUTANEOUS | 1 refills | Status: DC

## 2018-08-13 ENCOUNTER — Encounter: Payer: Self-pay | Admitting: Family Medicine

## 2018-08-13 MED ORDER — ALPRAZOLAM 0.5 MG PO TABS: 0.5000 mg | ORAL_TABLET | Freq: Every day | ORAL | 0 refills | Status: DC

## 2018-08-27 ENCOUNTER — Encounter: Payer: Self-pay | Admitting: Family Medicine

## 2018-08-29 ENCOUNTER — Telehealth: Payer: Self-pay | Admitting: Family Medicine

## 2018-08-29 NOTE — Telephone Encounter (Signed)
See note

## 2018-08-29 NOTE — Telephone Encounter (Signed)
Copied from [place] [id number]. Topic: Quick Communication - Rx Refill/Question >> Aug 29, 2018  2:37 PM Rayann Heman wrote: Medication:ALPRAZolam Duanne Moron) 0.5 MG tablet [[id number]] pt called and stated that insurance will not cover medication anymore. Pt would like a call back regarding.

## 2018-08-30 NOTE — Telephone Encounter (Signed)
Pt called back she said needs replacement for prilosec can only get 90 pills per year. Pt has used Nexium in the past. Please advise if can send Rx for Nexium and dosage?

## 2018-08-30 NOTE — Telephone Encounter (Signed)
Left message on voicemail to call office.  

## 2018-09-01 NOTE — Telephone Encounter (Signed)
Okay to change to Nexium 20 mg po daily. Call patient re: Xanax - cost of generic self pay price may be doable for them.

## 2018-09-02 NOTE — Telephone Encounter (Signed)
Called pt and left VM to call the office.  

## 2018-09-03 ENCOUNTER — Encounter: Payer: Self-pay | Admitting: Family Medicine

## 2018-09-03 MED ORDER — ESOMEPRAZOLE MAGNESIUM 20 MG PO CPDR: 20.0000 mg | DELAYED_RELEASE_CAPSULE | Freq: Every day | ORAL | 2 refills | Status: DC

## 2018-09-03 NOTE — Telephone Encounter (Signed)
Called and scheduled pt for tomorrow with Dr. Juleen China at 3:00 PM.

## 2018-09-03 NOTE — Telephone Encounter (Signed)
Spoke with pt and advised regarding change from Prilosec to Nexium. New rx sent to [facility].

## 2018-09-03 NOTE — Progress Notes (Signed)
Virtual Visit via Video   Due to the COVID-19 pandemic, this visit was completed with telemedicine (audio/video) technology to reduce patient and provider exposure as well as to preserve personal protective equipment.   I connected with Victoria York by a video enabled telemedicine application and verified that I am speaking with the correct person using two identifiers. Location patient: Home Location provider: [facility], Office Persons participating in the virtual visit: Victoria York, [place], DO   I discussed the limitations of evaluation and management by telemedicine and the availability of in person appointments. The patient expressed understanding and agreed to proceed.  Care Team   Patient Care Team: Briscoe Deutscher, DO as PCP - General (Family Medicine)  Subjective:   HPI:   Symptoms include: has difficulty falling asleep, has interrupted sleep and has difficulty awakening. The patient has been taking: Melatonin and trying other home remedies  . Side effects from the medication: she is not on any medications at this time. She was advised to try one of her husbands trazodone 50mg  at night. She felt like it helped her sleep once she was able to stay asleep. She will try taking two tab and see if that helps. She is also going to work on good sleep hygrine and let us know how she is doing in a few days. She will also work on exercising and getting back on a schedule.    Review of Systems  Constitutional: Negative for chills and fever.  HENT: Negative for hearing loss.   Skin: Negative for rash.     Patient Active Problem List   Diagnosis Date Noted  . Primary insomnia 09/04/2018  . Depression, recurrent ([place]) 09/04/2018  . 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 Daytime somnolence 01/09/2018  . Dysplastic nevus of skin 12/11/2017  . Anxiety 12/11/2017  . Vitamin D deficiency 12/11/2017  . 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 Irritable bowel syndrome 06/07/2016    . Posterior rhinorrhea 06/07/2016    Social History   Tobacco Use  . Smoking status: Former Smoker    Last attempt to quit: 12/12/2007    Years since quitting: 10.7  . Smokeless tobacco: Never Used  Substance Use Topics  . Alcohol use: No    Current Outpatient Medications:  .  ALPRAZolam (XANAX) 0.5 MG tablet, Take 1 tablet (0.5 mg total) by mouth at bedtime., Disp: 30 tablet, Rfl: 0 .  esomeprazole (NEXIUM) 20 MG capsule, Take 1 capsule (20 mg total) by mouth daily., Disp: 30 capsule, Rfl: 2 .  norgestimate-ethinyl estradiol (SPRINTEC 28) 0.25-35 MG-MCG tablet, Take continuously for three months, Disp: 4 Package, Rfl: 3 .  triamcinolone (KENALOG) 0.025 % ointment, Apply 1 application topically 2 (two) times daily., Disp: 80 g, Rfl: 1 .  FLUoxetine (PROZAC) 20 MG tablet, Take 1 1/2 tab to 2 tab daily, Disp: 60 tablet, Rfl: 3 .  traZODone (DESYREL) 50 MG tablet, 1-2 tab nightly as needed, Disp: 60 tablet, Rfl: 3  Allergies  Allergen Reactions  . Penicillins Hives    Objective:   VITALS: Per patient if applicable, see vitals. GENERAL: Alert, appears well and in no acute distress. HEENT: Atraumatic, conjunctiva clear, no obvious abnormalities on inspection of external nose and ears. NECK: Normal movements of the head and neck. CARDIOPULMONARY: No increased WOB. Speaking in clear sentences. I:E ratio WNL.  MS: Moves all visible extremities without noticeable abnormality. PSYCH: Pleasant and cooperative, well-groomed. Speech normal rate and rhythm. Affect is appropriate. Insight and judgement are  appropriate. Attention is focused, linear, and appropriate.  NEURO: CN grossly intact. Oriented as arrived to appointment on time with no prompting. Moves both UE equally.  SKIN: No obvious lesions, wounds, erythema, or cyanosis noted on face or hands.  Depression screen [facility] 2/9 07/05/2018 03/20/2018 12/11/2017  Decreased Interest 2 0 2  Down, Depressed, Hopeless 0 0 0  PHQ - 2 Score 2 0 2   Altered sleeping 2 1 1   Tired, decreased energy 2 0 1  Change in appetite 2 0 3  Feeling bad or failure about yourself  0 0 1  Trouble concentrating 2 0 3  Moving slowly or fidgety/restless 0 0 0  Suicidal thoughts 0 0 0  PHQ-9 Score 10 1 11   Difficult doing work/chores Not difficult at all - Somewhat difficult   Assessment and Plan:   [name] was seen today for insomnia.  Diagnoses and all orders for this visit:  Primary insomnia -     traZODone (DESYREL) 50 MG tablet; 1-2 tab nightly as needed  Intrinsic atopic dermatitis Comments: Needs refill today. Orders: -     triamcinolone (KENALOG) 0.025 % ointment; Apply 1 application topically 2 (two) times daily.  Daytime somnolence  Recurrent major depressive disorder, in partial remission (HCC) -     FLUoxetine (PROZAC) 20 MG tablet; Take 1 1/2 tab to 2 tab daily    . COVID-19 Education: The signs and symptoms of COVID-19 were discussed with the patient and how to seek care for testing if needed. The importance of social distancing was discussed today. . Reviewed expectations re: course of current medical issues. . Discussed self-management of symptoms. . Outlined signs and symptoms indicating need for more acute intervention. . Patient verbalized understanding and all questions were answered. Marland Kitchen Health Maintenance issues including appropriate healthy diet, exercise, and smoking avoidance were discussed with patient. . See orders for this visit as documented in the electronic medical record.  Briscoe Deutscher, DO  Records requested if needed. Time spent: 25 minutes, of which >50% was spent in obtaining information about her symptoms, reviewing her previous labs, evaluations, and treatments, counseling her about her condition (please see the discussed topics above), and developing a plan to further investigate it; she had a number of questions which I addressed.

## 2018-09-03 NOTE — Addendum Note (Signed)
Addended by: Jasper Loser on: 09/03/2018 02:08 PM   Modules accepted: Orders

## 2018-09-03 NOTE — Telephone Encounter (Signed)
See note

## 2018-09-03 NOTE — Telephone Encounter (Signed)
Pt returned call Please call [phone]

## 2018-09-04 ENCOUNTER — Ambulatory Visit (INDEPENDENT_AMBULATORY_CARE_PROVIDER_SITE_OTHER): Payer: 59 | Admitting: Family Medicine

## 2018-09-04 ENCOUNTER — Encounter: Payer: Self-pay | Admitting: Family Medicine

## 2018-09-04 ENCOUNTER — Other Ambulatory Visit: Payer: Self-pay

## 2018-09-04 VITALS — Ht 65.5 in | Wt 136.0 lb

## 2018-09-04 DIAGNOSIS — R400 Somnolence: Secondary | ICD-10-CM

## 2018-09-04 DIAGNOSIS — L2084 Intrinsic (allergic) eczema: Secondary | ICD-10-CM

## 2018-09-04 DIAGNOSIS — F339 Major depressive disorder, recurrent, unspecified: Secondary | ICD-10-CM | POA: Insufficient documentation

## 2018-09-04 DIAGNOSIS — F3341 Major depressive disorder, recurrent, in partial remission: Secondary | ICD-10-CM

## 2018-09-04 DIAGNOSIS — F5101 Primary insomnia: Secondary | ICD-10-CM | POA: Insufficient documentation

## 2018-09-04 MED ORDER — FLUOXETINE HCL 20 MG PO TABS: ORAL_TABLET | ORAL | 3 refills | Status: DC

## 2018-09-04 MED ORDER — TRAZODONE HCL 50 MG PO TABS: ORAL_TABLET | ORAL | 3 refills | Status: DC

## 2018-09-04 MED ORDER — TRIAMCINOLONE ACETONIDE 0.025 % EX OINT: 1.0000 "application " | TOPICAL_OINTMENT | Freq: Two times a day (BID) | CUTANEOUS | 1 refills | Status: DC

## 2018-09-06 ENCOUNTER — Telehealth: Payer: Self-pay

## 2018-09-06 ENCOUNTER — Other Ambulatory Visit: Payer: Self-pay

## 2018-09-06 DIAGNOSIS — L2084 Intrinsic (allergic) eczema: Secondary | ICD-10-CM

## 2018-09-06 MED ORDER — TRIAMCINOLONE ACETONIDE 0.025 % EX OINT: 1.0000 "application " | TOPICAL_OINTMENT | Freq: Two times a day (BID) | CUTANEOUS | 1 refills | Status: AC

## 2018-09-06 NOTE — Telephone Encounter (Signed)
Initiated via [email or web address] 

## 2018-09-06 NOTE — Telephone Encounter (Signed)
Victoria York (Key: [id number])   This request has received a Favorable outcome. Please note any additional information provided by [employer] at the bottom of this request.

## 2018-09-11 ENCOUNTER — Encounter: Payer: Self-pay | Admitting: Family Medicine

## 2018-09-23 ENCOUNTER — Encounter: Payer: Self-pay | Admitting: Family Medicine

## 2018-09-30 ENCOUNTER — Encounter: Payer: Self-pay | Admitting: Family Medicine

## 2018-09-30 ENCOUNTER — Other Ambulatory Visit: Payer: Self-pay

## 2018-09-30 ENCOUNTER — Ambulatory Visit (INDEPENDENT_AMBULATORY_CARE_PROVIDER_SITE_OTHER): Payer: 59 | Admitting: Family Medicine

## 2018-09-30 VITALS — Ht 65.5 in | Wt 143.0 lb

## 2018-09-30 DIAGNOSIS — Z3041 Encounter for surveillance of contraceptive pills: Secondary | ICD-10-CM

## 2018-09-30 DIAGNOSIS — D239 Other benign neoplasm of skin, unspecified: Secondary | ICD-10-CM

## 2018-09-30 NOTE — Progress Notes (Signed)
Virtual Visit via Video   Due to the COVID-19 pandemic, this visit was completed with telemedicine (audio/video) technology to reduce patient and provider exposure as well as to preserve personal protective equipment.   I connected with Lelon Huh by a video enabled telemedicine application and verified that I am speaking with the correct person using two identifiers. Location patient: Home Location provider: [facility], Office Persons participating in the virtual visit: Giannina, Bartolome, DO [name], CMA acting as scribe for Dr. Briscoe Deutscher.   I discussed the limitations of evaluation and management by telemedicine and the availability of in person appointments. The patient expressed understanding and agreed to proceed.  Care Team   Patient Care Team: Briscoe Deutscher, DO as PCP - General (Family Medicine)  Subjective:   HPI:   Hey Doctor Juleen China, I had a few questions for you since I have nothing better to do lol! First is, I'd like to have my tubes tied. Should I just make an appointment with Dr. Talbert Nan or you? Second question, I'm looking for a knew dentist, for myself. Is it possible for you recommend one? Routine stuff and a wisdom tooth this is bothering me. Third question, we still have not received anything in regards to [facility] visiting a ENT. The last question is, I have a ton of new moles popping up and possibly a sun spot that I would like to have checked.    Review of Systems  Constitutional: Negative for chills and fever.  HENT: Negative for hearing loss and tinnitus.   Eyes: Negative for blurred vision and double vision.  Respiratory: Negative for cough.   Cardiovascular: Negative for chest pain and palpitations.  Gastrointestinal: Negative for nausea and vomiting.  Genitourinary: Negative for dysuria and urgency.  Musculoskeletal: Negative for myalgias.  Neurological: Negative for dizziness and headaches.  Psychiatric/Behavioral: Negative  for depression and suicidal ideas.     Patient Active Problem List   Diagnosis Date Noted  . Primary insomnia 09/04/2018  . Depression, recurrent ([place]) 09/04/2018  . 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 Daytime somnolence 01/09/2018  . Dysplastic nevus of skin 12/11/2017  . Anxiety 12/11/2017  . Vitamin D deficiency 12/11/2017  . 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 Irritable bowel syndrome 06/07/2016  . Posterior rhinorrhea 06/07/2016    Social History   Tobacco Use  . Smoking status: Former Smoker    Last attempt to quit: 12/12/2007    Years since quitting: 10.8  . Smokeless tobacco: Never Used  Substance Use Topics  . Alcohol use: No    Current Outpatient Medications:  .  ALPRAZolam (XANAX) 0.5 MG tablet, Take 1 tablet (0.5 mg total) by mouth at bedtime., Disp: 30 tablet, Rfl: 0 .  esomeprazole (NEXIUM) 20 MG capsule, Take 1 capsule (20 mg total) by mouth daily., Disp: 30 capsule, Rfl: 2 .  FLUoxetine (PROZAC) 20 MG tablet, Take 1 1/2 tab to 2 tab daily, Disp: 60 tablet, Rfl: 3 .  norgestimate-ethinyl estradiol (SPRINTEC 28) 0.25-35 MG-MCG tablet, Take continuously for three months, Disp: 4 Package, Rfl: 3 .  traZODone (DESYREL) 50 MG tablet, 1-2 tab nightly as needed, Disp: 60 tablet, Rfl: 3 .  triamcinolone (KENALOG) 0.025 % ointment, Apply 1 application topically 2 (two) times daily., Disp: 100 g, Rfl: 1  Allergies  Allergen Reactions  . Penicillins Hives    Objective:   VITALS: Per patient if applicable, see vitals. GENERAL: Alert, appears well and in no acute distress. HEENT: Atraumatic, conjunctiva  clear, no obvious abnormalities on inspection of external nose and ears. NECK: Normal movements of the head and neck. CARDIOPULMONARY: No increased WOB. Speaking in clear sentences. I:E ratio WNL.  MS: Moves all visible extremities without noticeable abnormality. PSYCH: Pleasant and cooperative, well-groomed. Speech normal rate and rhythm. Affect is appropriate.  Insight and judgement are appropriate. Attention is focused, linear, and appropriate.  NEURO: CN grossly intact. Oriented as arrived to appointment on time with no prompting. Moves both UE equally.  SKIN: No obvious lesions, wounds, erythema, or cyanosis noted on face or hands.  Depression screen [facility] 2/9 07/05/2018 03/20/2018 12/11/2017  Decreased Interest 2 0 2  Down, Depressed, Hopeless 0 0 0  PHQ - 2 Score 2 0 2  Altered sleeping 2 1 1   Tired, decreased energy 2 0 1  Change in appetite 2 0 3  Feeling bad or failure about yourself  0 0 1  Trouble concentrating 2 0 3  Moving slowly or fidgety/restless 0 0 0  Suicidal thoughts 0 0 0  PHQ-9 Score 10 1 11   Difficult doing work/chores Not difficult at all - Somewhat difficult    Assessment and Plan:   Reynalda was seen today for follow-up.  Diagnoses and all orders for this visit:  Dysplastic nevus of skin -     Ambulatory referral to Dermatology  Encounter for surveillance of contraceptive pills -     Ambulatory referral to Gynecology    . COVID-19 Education: The signs and symptoms of COVID-19 were discussed with the patient and how to seek care for testing if needed. The importance of social distancing was discussed today. . Reviewed expectations re: course of current medical issues. . Discussed self-management of symptoms. . Outlined signs and symptoms indicating need for more acute intervention. . Patient verbalized understanding and all questions were answered. Marland Kitchen Health Maintenance issues including appropriate healthy diet, exercise, and smoking avoidance were discussed with patient. . See orders for this visit as documented in the electronic medical record.  Briscoe Deutscher, DO  Records requested if needed. Time spent: 15 minutes, of which >50% was spent in obtaining information about her symptoms, reviewing her previous labs, evaluations, and treatments, counseling her about her condition (please see the discussed topics  above), and developing a plan to further investigate it; she had a number of questions which I addressed.

## 2018-09-30 NOTE — Patient Instructions (Addendum)
   [place], [place] [address] Phone: [phone]  Referral has been started for Dr. Talbert Nan and Dermatology

## 2018-10-15 ENCOUNTER — Other Ambulatory Visit: Payer: Self-pay

## 2018-10-15 ENCOUNTER — Ambulatory Visit: Payer: 59 | Admitting: Obstetrics and Gynecology

## 2018-10-15 ENCOUNTER — Encounter: Payer: Self-pay | Admitting: Obstetrics and Gynecology

## 2018-10-15 VITALS — BP 104/82 | HR 80 | Temp 98.2°F | Wt 137.4 lb

## 2018-10-15 DIAGNOSIS — Z3009 Encounter for other general counseling and advice on contraception: Secondary | ICD-10-CM | POA: Diagnosis not present

## 2018-10-15 DIAGNOSIS — F3281 Premenstrual dysphoric disorder: Secondary | ICD-10-CM | POA: Diagnosis not present

## 2018-10-15 NOTE — Patient Instructions (Signed)
Laparoscopic Tubal Ligation Laparoscopic tubal ligation is a procedure to close the fallopian tubes. This is done so that you cannot get pregnant. When the fallopian tubes are closed, the eggs that your ovaries release cannot enter the uterus, and sperm cannot reach the released eggs. A laparoscopic tubal ligation is sometimes called "getting your tubes tied." You should not have this procedure if you want to get pregnant someday or if you are unsure about having more children. Tell a health care provider about:  Any allergies you have.  All medicines you are taking, including vitamins, herbs, eye drops, creams, and over-the-counter medicines.  Any problems you or family members have had with anesthetic medicines.  Any blood disorders you have.  Any surgeries you have had.  Any medical conditions you have.  Whether you are pregnant or may be pregnant.  Any past pregnancies. What are the risks? Generally, this is a safe procedure. However, problems may occur, including:  Infection.  Bleeding.  Injury to surrounding organs.  Side effects from anesthetics.  Failure of the procedure. This procedure can increase your risk of a kind of pregnancy in which a fertilized egg attaches to the outside of the uterus (ectopic pregnancy). What happens before the procedure?  Ask your health care provider about: ? Changing or stopping your regular medicines. This is especially important if you are taking diabetes medicines or blood thinners. ? Taking medicines such as aspirin and ibuprofen. These medicines can thin your blood. Do not take these medicines before your procedure if your health care provider instructs you not to.  Follow instructions from your health care provider about eating and drinking restrictions.  Plan to have someone take you home after the procedure.  If you go home right after the procedure, plan to have someone with you for 24 hours. What happens during the  procedure?      You will be given one or more of the following: ? A medicine to help you relax (sedative). ? A medicine to numb the area (local anesthetic). ? A medicine to make you fall asleep (general anesthetic). ? A medicine that is injected into an area of your body to numb everything below the injection site (regional anesthetic).  An IV tube will be inserted into one of your veins. It will be used to give you medicines and fluids during the procedure.  Your bladder may be emptied with a small tube (catheter).  If you have been given a general anesthetic, a tube will be put down your throat to help you breathe.  Two small cuts (incisions) will be made in your lower abdomen and near your belly button.  Your abdomen will be inflated with a gas. This will let the surgeon see better and will give the surgeon room to work.  A thin, lighted tube (laparoscope) with a camera attached will be inserted into your abdomen through one of the incisions. Small instruments will be inserted through the other incision.  The fallopian tubes will be tied off, burned (cauterized), or blocked with a clip, ring, or clamp. A small portion in the center of each fallopian tube may be removed.  The gas will be released from the abdomen.  The incisions will be closed with stitches (sutures).  A bandage (dressing) will be placed over the incisions. The procedure may vary among health care providers and hospitals. What happens after the procedure?  Your blood pressure, heart rate, breathing rate, and blood oxygen level will be monitored often until   the medicines you were given have worn off.  You will be given medicine to help with pain, nausea, and vomiting as needed. This information is not intended to replace advice given to you by your health care provider. Make sure you discuss any questions you have with your health care provider. Document Released: 07/24/2000 Document Revised: 12/12/2016 Document  Reviewed: 03/28/2015 Elsevier Interactive Patient Education  2019 Elsevier Inc.  

## 2018-10-15 NOTE — Progress Notes (Signed)
GYNECOLOGY  VISIT   HPI: 33 y.o.   Married White or Caucasian Not Hispanic or Latino  female   [phone] with Patient's last menstrual period was 09/17/2018 (approximate).   here for consult regarding tubal ligation. The patient was started on OCP's last year for contraception and PMDD. Prior to the pill she reported cycles q 3-5 weeks x 5-7 days. She would saturated a super tampon in up to 2-3 hours. Manageable cramps.   Kids are 6 and 11. She is really sure she doesn't want any more children and would like to get a tubal ligation. She is on OCP's, but sometimes forgets her pills. She wants to stay on OCP's for PMDD. She is taking OCP's cyclically and doing great. She feels amazing on a combination of prozac and the pill.  Husband never gets around to getting a vasectomy.   GYNECOLOGIC HISTORY: Patient's last menstrual period was 09/17/2018 (approximate). Contraception:OCP Menopausal hormone therapy: None        OB History    Gravida  5   Para  2   Term  2   Preterm      AB  3   Living  2     SAB  2   TAB  1   Ectopic      Multiple      Live Births  2              Patient Active Problem List   Diagnosis Date Noted  . Primary insomnia 09/04/2018  . Depression, recurrent ([place]) 09/04/2018  . 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 Daytime somnolence 01/09/2018  . Dysplastic nevus of skin 12/11/2017  . Anxiety 12/11/2017  . Vitamin D deficiency 12/11/2017  . 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 Irritable bowel syndrome 06/07/2016  . Posterior rhinorrhea 06/07/2016    Past Medical History:  Diagnosis Date  . Abnormal Pap smear   . Abnormal uterine bleeding   . Anemia with pregnancies   . Anxiety 12/11/2017  . Depression   . Dysmenorrhea   . Dysplastic nevus of skin 12/11/2017  . Hormone disorder   . Hx of varicella   . Irritable bowel syndrome 06/07/2016  . Vitamin D deficiency 12/11/2017    Past Surgical History:  Procedure Laterality Date  .  COLPOSCOPY    . DILATION AND CURETTAGE OF UTERUS    . LEEP    . MINOR HEMORRHOIDECTOMY      Current Outpatient Medications  Medication Sig Dispense Refill  . ALPRAZolam (XANAX) 0.5 MG tablet Take 1 tablet (0.5 mg total) by mouth at bedtime. 30 tablet 0  . FLUoxetine (PROZAC) 20 MG tablet Take 1 1/2 tab to 2 tab daily 60 tablet 3  . norgestimate-ethinyl estradiol (SPRINTEC 28) 0.25-35 MG-MCG tablet Take continuously for three months 4 Package 3  . omeprazole (PRILOSEC) 40 MG capsule Take 40 mg by mouth daily.    . traZODone (DESYREL) 50 MG tablet 1-2 tab nightly as needed 60 tablet 3  . triamcinolone (KENALOG) 0.025 % ointment Apply 1 application topically 2 (two) times daily. 100 g 1   No current facility-administered medications for this visit.      ALLERGIES: Penicillins  Family History  Problem Relation Age of Onset  . Alcohol abuse Mother   . Arthritis Mother   . Depression Mother   . Drug abuse Mother   . Miscarriages / [place] Mother   . Bipolar disorder Mother   . Alcohol abuse Father   . COPD Father   .  Depression Sister   . Colon cancer Maternal Aunt   . Ovarian cancer Maternal Aunt   . Hypertension Maternal Grandmother   . Alcohol abuse Sister   . Alcohol abuse Sister     Social History   Socioeconomic History  . Marital status: Married    Spouse name: Not on file  . Number of children: 2  . Years of education: Not on file  . Highest education level: Not on file  Occupational History  . Not on file  Social Needs  . Financial resource strain: Not on file  . Food insecurity    Worry: Not on file    Inability: Not on file  . Transportation needs    Medical: Not on file    Non-medical: Not on file  Tobacco Use  . Smoking status: Former Smoker    Quit date: 12/12/2007    Years since quitting: 10.8  . Smokeless tobacco: Never Used  Substance and Sexual Activity  . Alcohol use: No  . Drug use: No  . Sexual activity: Yes    Birth  control/protection: Pill  Lifestyle  . Physical activity    Days per week: Not on file    Minutes per session: Not on file  . Stress: Not on file  Relationships  . Social Herbalist on phone: Not on file    Gets together: Not on file    Attends religious service: Not on file    Active member of club or organization: Not on file    Attends meetings of clubs or organizations: Not on file    Relationship status: Not on file  . Intimate partner violence    Fear of current or ex partner: Not on file    Emotionally abused: Not on file    Physically abused: Not on file    Forced sexual activity: Not on file  Other Topics Concern  . Not on file  Social History Narrative  . Not on file    Review of Systems  Constitutional: Negative.   HENT: Negative.   Eyes: Negative.   Respiratory: Negative.   Cardiovascular: Negative.   Gastrointestinal: Negative.   Genitourinary: Negative.   Musculoskeletal: Negative.   Skin: Negative.   Neurological: Negative.   Endo/Heme/Allergies: Negative.   Psychiatric/Behavioral: Negative.     PHYSICAL EXAMINATION:    BP 104/82 (BP Location: Right Arm, Patient Position: Sitting, Cuff Size: Normal)   Pulse 80   Temp 98.2 F (36.8 C) (Skin)   Wt 137 lb 6.4 oz (62.3 kg)   LMP 09/17/2018 (Approximate)   BMI 22.52 kg/m     General appearance: alert, cooperative and appears stated age Neck: no adenopathy, supple, symmetrical, trachea midline and thyroid normal to inspection and palpation Heart: regular rate and rhythm Lungs: CTAB Abdomen: soft, non-tender; bowel sounds normal; no masses,  no organomegaly Extremities: normal, atraumatic, no cyanosis Skin: normal color, texture and turgor, no rashes or lesions Lymph: normal cervical supraclavicular and inguinal nodes Neurologic: grossly normal    ASSESSMENT Contraception management, forgets her pill at times, desires sterilization PMDD, controlled on OCP's and Prozac     PLAN Discussed Laparoscopic salpingectomies Discussed other long term options of contraception, specifically IUD's She would like to proceed with laparoscopic salpingectomies. Surgery, risks of surgery and recovery time reviewed.    An After Visit Summary was printed and given to the patient.  ~15 minutes face to face time of which over 50% was spent in counseling.  CC: Dr Juleen China

## 2018-10-22 ENCOUNTER — Telehealth: Payer: Self-pay | Admitting: *Deleted

## 2018-10-22 NOTE — Telephone Encounter (Signed)
Call to patient. Reviewed benefits of coverage for Lap BS versus Lap BTSP. Patient requested information be relayed to husband as well and handed phone to him. Explained difference in procedures and coverage. Advised can have consult with provider to discuss pros/cons. They will discuss options and call back.   Routing to Dr Talbert Nan for review.

## 2018-11-04 ENCOUNTER — Telehealth: Payer: Self-pay | Admitting: Family Medicine

## 2018-11-04 ENCOUNTER — Other Ambulatory Visit: Payer: Self-pay

## 2018-11-04 DIAGNOSIS — F5102 Adjustment insomnia: Secondary | ICD-10-CM

## 2018-11-04 DIAGNOSIS — F339 Major depressive disorder, recurrent, unspecified: Secondary | ICD-10-CM

## 2018-11-04 DIAGNOSIS — F3341 Major depressive disorder, recurrent, in partial remission: Secondary | ICD-10-CM

## 2018-11-04 DIAGNOSIS — F5101 Primary insomnia: Secondary | ICD-10-CM

## 2018-11-04 MED ORDER — FLUOXETINE HCL 20 MG PO TABS: ORAL_TABLET | ORAL | 3 refills | Status: DC

## 2018-11-04 MED ORDER — OMEPRAZOLE 40 MG PO CPDR: 40.0000 mg | DELAYED_RELEASE_CAPSULE | Freq: Every day | ORAL | 1 refills | Status: AC

## 2018-11-04 MED ORDER — TRAZODONE HCL 50 MG PO TABS: ORAL_TABLET | ORAL | 3 refills | Status: DC

## 2018-11-04 MED ORDER — ALPRAZOLAM 0.5 MG PO TABS: 0.5000 mg | ORAL_TABLET | Freq: Every day | ORAL | 0 refills | Status: AC

## 2018-11-04 MED ORDER — NORGESTIMATE-ETH ESTRADIOL 0.25-35 MG-MCG PO TABS: ORAL_TABLET | ORAL | 3 refills | Status: AC

## 2018-11-04 NOTE — Telephone Encounter (Signed)
Patient calling in to request refills on all of her medications.  She and her husband are moving out of state to [place] and need 1 more refill on all of their meds before moving.   She has plenty of Kenalog ointment and does not need refill for this.

## 2018-11-04 NOTE — Telephone Encounter (Signed)
See note

## 2018-11-04 NOTE — Telephone Encounter (Signed)
Pt called and stated that she would like a callback from the nurse regarding refills. Pt states that she is moving out of town and would like to discuss this with the nurse. Please advise

## 2018-11-28 ENCOUNTER — Other Ambulatory Visit: Payer: Self-pay | Admitting: Family Medicine

## 2018-11-28 DIAGNOSIS — F5101 Primary insomnia: Secondary | ICD-10-CM

## 2018-11-28 DIAGNOSIS — F3341 Major depressive disorder, recurrent, in partial remission: Secondary | ICD-10-CM

## 2018-11-29 NOTE — Telephone Encounter (Signed)
Pt requesting 90 day. Last fill Fluoxetine 11/04/18  #60/3 Last fill Trazodone  11/04/18  #60/3 Last OV 09/30/18

## 2018-12-20 ENCOUNTER — Encounter: Payer: 59 | Admitting: Family Medicine

## 2019-05-25 IMAGING — DX DG CHEST 2V
2 series · 2 of 2 positions shown · non-contrast
Comparison: None.

CLINICAL DATA: Cough

EXAM:
CHEST - 2 VIEW

[chest pa]
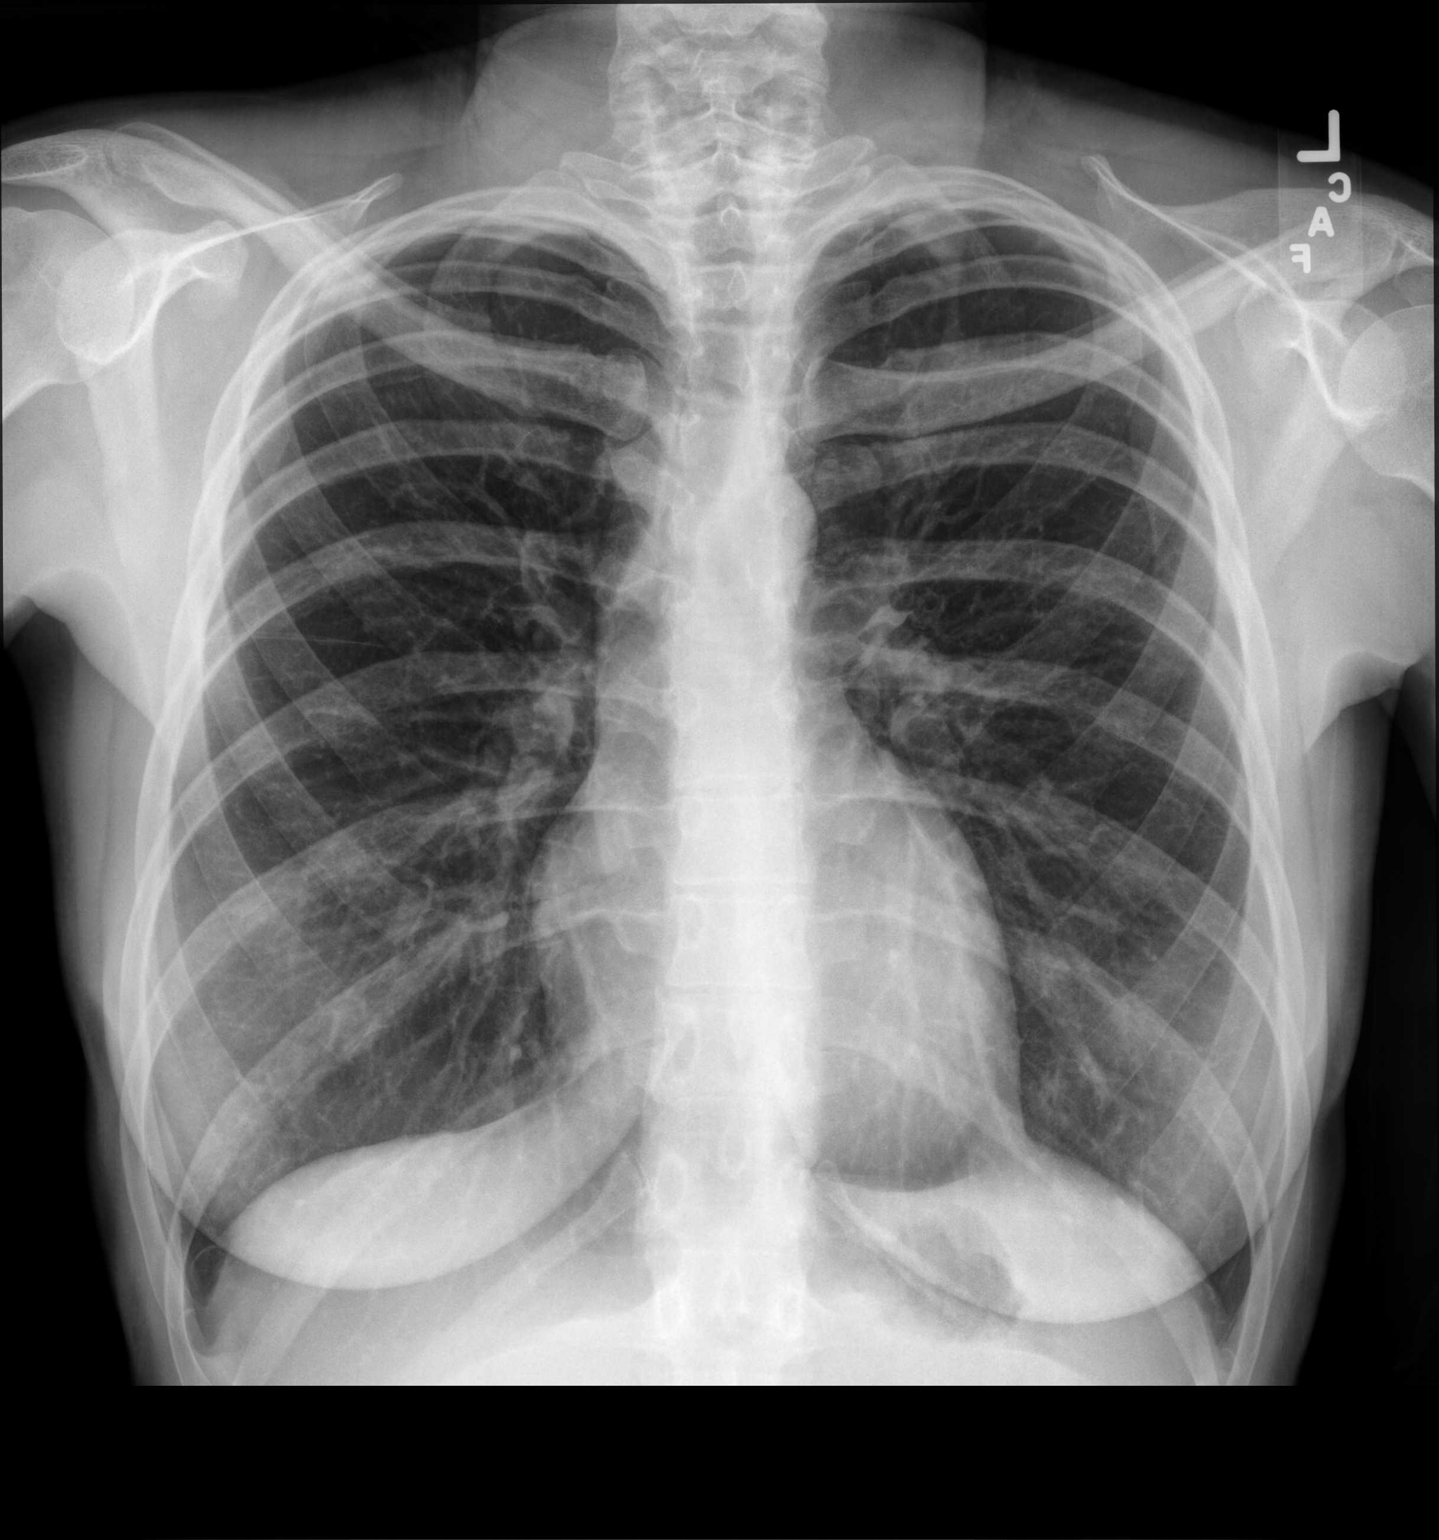

[chest lat]
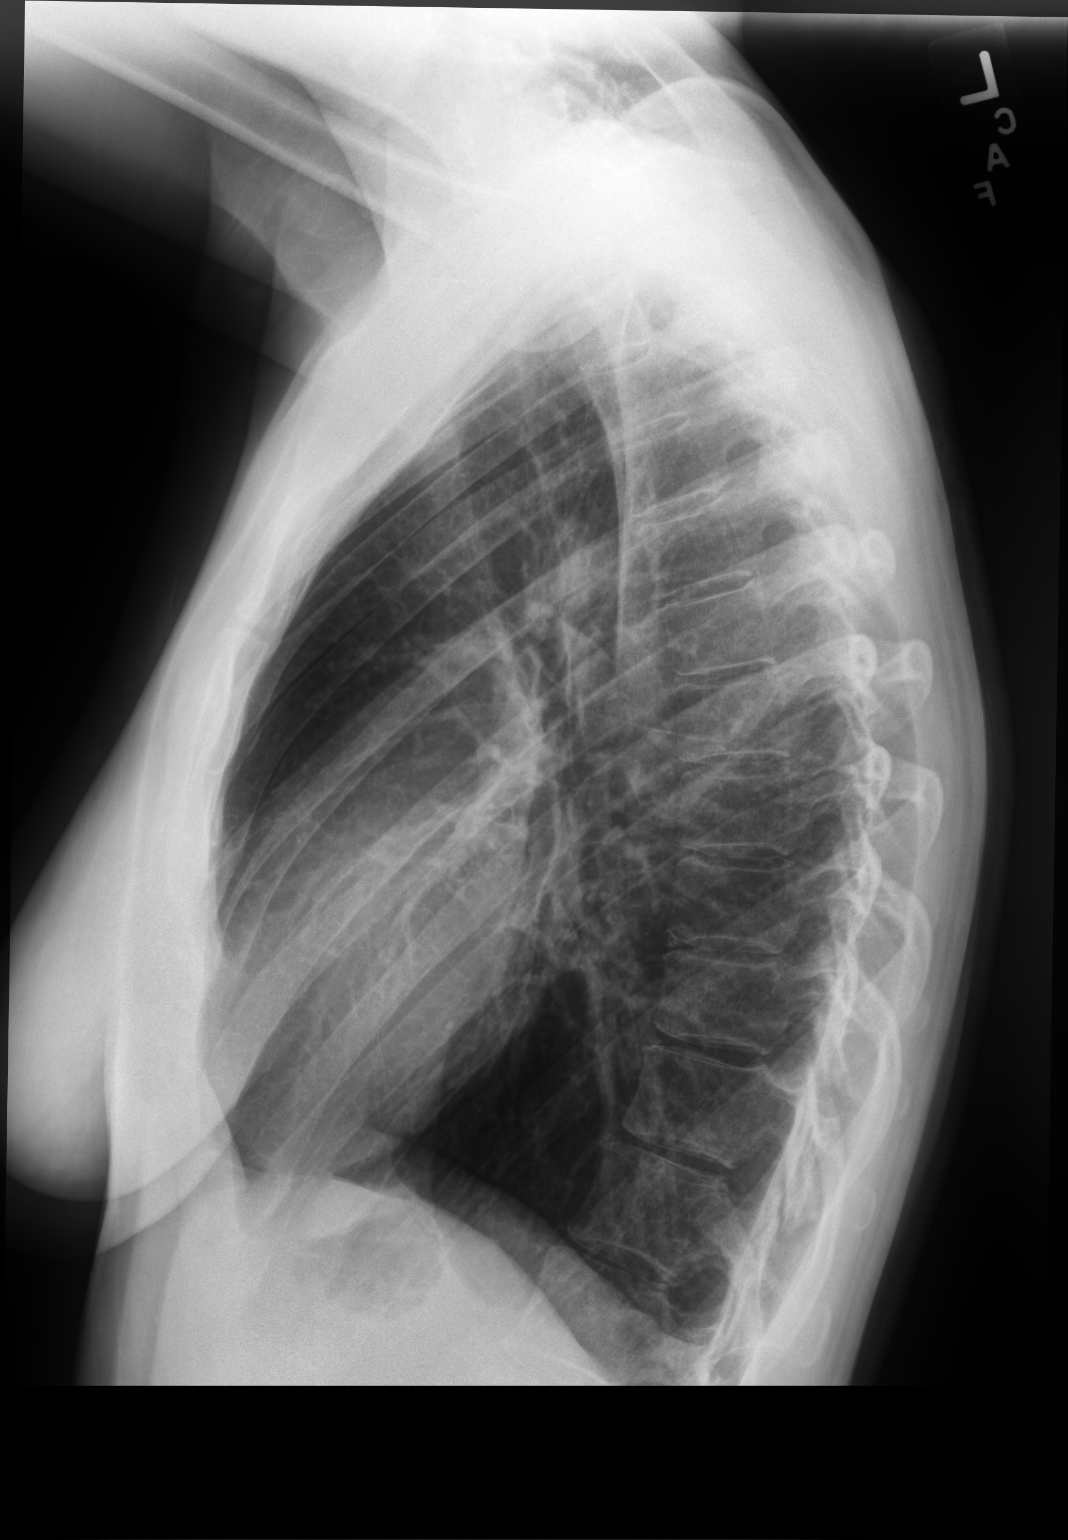

[2 of 2 positions shown; findings below may reference images not displayed]

FINDINGS: The heart size and mediastinal contours are within normal limits.
Both lungs are clear. The visualized skeletal structures are
unremarkable.
IMPRESSION: No active cardiopulmonary disease.
